# Patient Record
Sex: Female | Born: 1975 | Race: White | Hispanic: No | Marital: Married | Smoking: Never smoker
Health system: Southern US, Community
[De-identification: ages and names within clinical notes are randomized; demographics above are authoritative.]

## PROBLEM LIST (undated history)

## (undated) DIAGNOSIS — M1990 Unspecified osteoarthritis, unspecified site: Secondary | ICD-10-CM

## (undated) DIAGNOSIS — O0001 Abdominal pregnancy with intrauterine pregnancy: Secondary | ICD-10-CM

## (undated) DIAGNOSIS — O24419 Gestational diabetes mellitus in pregnancy, unspecified control: Secondary | ICD-10-CM

## (undated) DIAGNOSIS — E785 Hyperlipidemia, unspecified: Secondary | ICD-10-CM

## (undated) DIAGNOSIS — F988 Other specified behavioral and emotional disorders with onset usually occurring in childhood and adolescence: Secondary | ICD-10-CM

## (undated) HISTORY — DX: Gestational diabetes mellitus in pregnancy, unspecified control: O24.419

## (undated) HISTORY — PX: ANTERIOR CRUCIATE LIGAMENT REPAIR: SHX115

## (undated) HISTORY — DX: Unspecified osteoarthritis, unspecified site: M19.90

## (undated) HISTORY — DX: Abdominal pregnancy with intrauterine pregnancy: O00.01

## (undated) HISTORY — DX: Other specified behavioral and emotional disorders with onset usually occurring in childhood and adolescence: F98.8

## (undated) HISTORY — DX: Hyperlipidemia, unspecified: E78.5

---

## 2010-03-17 ENCOUNTER — Ambulatory Visit
Admission: RE | Admit: 2010-03-17 | Discharge: 2010-03-17 | Payer: Self-pay | Source: Home / Self Care | Attending: Internal Medicine | Admitting: Internal Medicine

## 2010-03-17 DIAGNOSIS — O0001 Abdominal pregnancy with intrauterine pregnancy: Secondary | ICD-10-CM

## 2010-03-17 HISTORY — DX: Abdominal pregnancy with intrauterine pregnancy: O00.01

## 2010-04-03 NOTE — Assessment & Plan Note (Signed)
Summary: NEW ACUTE/HEAD AND CHEST CONGESTION/PT 1 MONTH PREGNANT/OK PE...   Vital Signs:  Patient profile:   35 year old female Weight:      134 pounds Pulse rate:   74 / minute BP sitting:   114 / 78  (left arm)  Vitals Entered By: Kyung Rudd, CMA (March 17, 2010 3:50 PM) CC: pt c/o head and chest congestion and cough...has rx for amoxil from [place] and was just told by phn about a pos strep cx   CC:  pt c/o head and chest congestion and cough...has rx for amoxil from [place] and was just told by phn about a pos strep cx.  History of Present Illness: Patient presents to clinic as a workin for evaluation of sore throat. Notes 3d h/o ST, initial f/c now resolved, and productive cough. Was seen at [place] [facility] several days ago with reportedly neg rapid strep. Was contacted today and told + culture for strep. Recently found she was pregnant less than 6wks. No other complaints.  Preventive Screening-Counseling & Management  Alcohol-Tobacco     Smoking Status: never     Smoking Cessation Counseling: no     Tobacco Counseling: not indicated; no tobacco use  Problems Prior to Update: 1)  Abdominal Pregnancy With Intrauterine Pregnancy  (ICD-633.01)  Current Problems (verified): 1)  Abdominal Pregnancy With Intrauterine Pregnancy  (ICD-633.01)  Medications Prior to Update: 1)  None  Past History:  Past Medical History: Unremarkable  Social History: Smoking Status:  never  Review of Systems      See HPI  Physical Exam  General:  Well-developed,well-nourished,in no acute distress; alert,appropriate and cooperative throughout examination Head:  Normocephalic and atraumatic without obvious abnormalities. No apparent alopecia or balding. Eyes:  pupils equal, pupils round, corneas and lenses clear, and no injection.   Ears:  External ear exam shows no significant lesions or deformities.  Otoscopic examination reveals clear canals, tympanic membranes are intact bilaterally  without bulging, retraction, inflammation or discharge. Hearing is grossly normal bilaterally. Nose:  External nasal examination shows no deformity or inflammation. Nasal mucosa are pink and moist without lesions or exudates. Mouth:  posterior erythema without exudate.no lesions and no aphthous ulcers.   Neck:  No deformities, masses, or tenderness noted. Lungs:  Normal respiratory effort, chest expands symmetrically. Lungs are clear to auscultation, no crackles or wheezes. Heart:  Normal rate and regular rhythm. S1 and S2 normal without gallop, murmur, click, rub or other extra sounds.   Impression & Recommendations:  Problem # 1:  STREPTOCOCCAL PHARYNGITIS (ICD-034.0) Assessment New Begin abx. Followup if no improvement or worsening.  Her updated medication list for this problem includes:    Amoxicillin 875 Mg Tabs (Amoxicillin) ..... One by mouth bid  Problem # 2:  ABDOMINAL PREGNANCY WITH INTRAUTERINE PREGNANCY (ICD-633.01) Assessment: New  Orders: Obstetric Referral (Obstetric)  Complete Medication List: 1)  Amoxicillin 875 Mg Tabs (Amoxicillin) .... One by mouth bid Prescriptions: AMOXICILLIN 875 MG TABS (AMOXICILLIN) one by mouth bid  #14 x 0   Entered and Authorized by:   Edwyna Perfect MD   Signed by:   Edwyna Perfect MD on 03/17/2010   Method used:   Print then Give to Patient   RxID:   [id number]    Orders Added: 1)  Obstetric Referral [Obstetric] 2)  New Patient Level II [56213]

## 2011-05-07 ENCOUNTER — Other Ambulatory Visit: Payer: Self-pay

## 2011-05-07 ENCOUNTER — Other Ambulatory Visit: Payer: Self-pay | Admitting: Family Medicine

## 2011-05-07 DIAGNOSIS — M25571 Pain in right ankle and joints of right foot: Secondary | ICD-10-CM

## 2011-05-09 ENCOUNTER — Ambulatory Visit
Admission: RE | Admit: 2011-05-09 | Discharge: 2011-05-09 | Disposition: A | Discharge status: Home / Self Care | Payer: BC Managed Care – PPO | Source: Ambulatory Visit | Attending: Family Medicine | Admitting: Family Medicine

## 2011-05-09 DIAGNOSIS — M25571 Pain in right ankle and joints of right foot: Secondary | ICD-10-CM

## 2012-05-30 DIAGNOSIS — R87612 Low grade squamous intraepithelial lesion on cytologic smear of cervix (LGSIL): Secondary | ICD-10-CM | POA: Insufficient documentation

## 2012-05-30 HISTORY — DX: Low grade squamous intraepithelial lesion on cytologic smear of cervix (LGSIL): R87.612

## 2012-11-08 ENCOUNTER — Encounter: Payer: Self-pay | Admitting: *Deleted

## 2012-11-23 ENCOUNTER — Ambulatory Visit: Payer: BC Managed Care – PPO | Admitting: Cardiology

## 2014-10-17 ENCOUNTER — Telehealth: Payer: Self-pay | Admitting: Cardiology

## 2014-10-17 NOTE — Telephone Encounter (Signed)
Received records from [facility] for appointment with Dr Jens Som on 10/18/14.  Records given to [facility] (medical records) for Dr Ludwig Clarks schedule on 10/18/14. lp.

## 2014-10-18 ENCOUNTER — Ambulatory Visit (INDEPENDENT_AMBULATORY_CARE_PROVIDER_SITE_OTHER): Payer: 59 | Admitting: Cardiology

## 2014-10-18 ENCOUNTER — Encounter: Payer: Self-pay | Admitting: Cardiology

## 2014-10-18 VITALS — BP 122/84 | HR 60 | Ht 64.0 in | Wt 127.1 lb

## 2014-10-18 DIAGNOSIS — R072 Precordial pain: Secondary | ICD-10-CM

## 2014-10-18 DIAGNOSIS — R079 Chest pain, unspecified: Secondary | ICD-10-CM | POA: Diagnosis not present

## 2014-10-18 DIAGNOSIS — E785 Hyperlipidemia, unspecified: Secondary | ICD-10-CM | POA: Diagnosis not present

## 2014-10-18 DIAGNOSIS — E782 Mixed hyperlipidemia: Secondary | ICD-10-CM | POA: Insufficient documentation

## 2014-10-18 NOTE — Patient Instructions (Signed)
Your physician recommends that you schedule a follow-up appointment in: AS NEEDED PENDING TEST RESULTS  Your physician has requested that you have an exercise tolerance test. For further information please visit [email or web address]. Please also follow instruction sheet, as given.     Exercise Stress Electrocardiogram An exercise stress electrocardiogram is a test to check how blood flows to your heart. It is done to find areas of poor blood flow. You will need to walk on a treadmill for this test. The electrocardiogram will record your heartbeat when you are at rest and when you are exercising. BEFORE THE PROCEDURE  Do not have drinks with caffeine or foods with caffeine for 24 hours before the test, or as told by your doctor. This includes coffee, tea (even decaf tea), sodas, chocolate, and cocoa.  Follow your doctor's instructions about eating and drinking before the test.  Ask your doctor what medicines you should or should not take before the test. Take your medicines with water unless told by your doctor not to.  If you use an inhaler, bring it with you to the test.  Bring a snack to eat after the test.  Do not  smoke for 4 hours before the test.  Do not put lotions, powders, creams, or oils on your chest before the test.  Wear comfortable shoes and clothing. PROCEDURE  You will have patches put on your chest. Small areas of your chest may need to be shaved. Wires will be connected to the patches.  Your heart rate will be watched while you are resting and while you are exercising.  You will walk on the treadmill. The treadmill will slowly get faster to raise your heart rate.  The test will take about 1-2 hours. AFTER THE PROCEDURE  Your heart rate and blood pressure will be watched after the test.  You may return to your normal diet, activities, and medicines or as told by your doctor. Document Released: 08/05/2007 Document Revised: 07/03/2013 Document Reviewed:  10/24/2012 ExitCare Patient Information 2015 [employer]. This information is not intended to replace advice given to you by your health care provider. Make sure you discuss any questions you have with your health care provider.   

## 2014-10-18 NOTE — Assessment & Plan Note (Signed)
Continue diet. Management per primary care.

## 2014-10-18 NOTE — Assessment & Plan Note (Signed)
Symptoms atypical. Plan exercise treadmill for risk stratification. 

## 2014-10-18 NOTE — Progress Notes (Signed)
     HPI: 39 year old female for evaluation of chest pain. Patient states that for the past 6 months she has had occasional chest pain. The pain is above the left breast without radiation. It is described as a sharp pain. It occurs when she is running. It lasts 2 seconds and causes her to stop. No associated symptoms. Not pleuritic or positional. She also has an occasional "lightening bolt" pain when she is stressed. She denies dyspnea on exertion, orthopnea, PND, pedal edema, palpitations or syncope.  Current Outpatient Prescriptions  Medication Sig Dispense Refill  . norethindrone-ethinyl estradiol (MICROGESTIN,JUNEL,LOESTRIN) 1-20 MG-MCG tablet Take 1 tablet by mouth daily.     No current facility-administered medications for this visit.    Allergies  Allergen Reactions  . Demerol [Meperidine] Nausea Only     Past Medical History  Diagnosis Date  . Abdominal pregnancy with intrauterine pregnancy   . Hyperlipemia   . Gestational diabetes     Past Surgical History  Procedure Laterality Date  . Anterior cruciate ligament repair      Social History   Social History  . Marital Status: Divorced    Spouse Name: N/A  . Number of Children: 1  . Years of Education: N/A   Occupational History  .      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 Smoking status: Former Games developer  . Smokeless tobacco: Never Used  . Alcohol Use: 0.6 oz/week    1 Standard drinks or equivalent per week     Comment: Occasional  . Drug Use: No  . Sexual Activity: Not on file   Other Topics Concern  . Not on file   Social History Narrative    Family History  Problem Relation Age of Onset  . Heart attack Maternal Grandfather     4X  . Hypertension Maternal Grandmother   . Hypertension Mother   . Heart attack Maternal Grandmother   . Diabetes Father   . Stroke Mother     ROS: no fevers or chills, productive cough, hemoptysis, dysphasia, odynophagia, melena, hematochezia, dysuria,  hematuria, rash, seizure activity, orthopnea, PND, pedal edema, claudication. Remaining systems are negative.  Physical Exam:   Blood pressure 122/84, pulse 60, height  (1.626 m), weight 127 lb 2 oz (57.664 kg), last menstrual period 10/04/2014.  General:  Well developed/well nourished in NAD Skin warm/dry Patient not depressed No peripheral clubbing Back-normal HEENT-normal/normal eyelids Neck supple/normal carotid upstroke bilaterally; no bruits; no JVD; no thyromegaly chest - CTA/ normal expansion CV - RRR/normal S1 and S2; no murmurs, rubs or gallops;  PMI nondisplaced Abdomen -NT/ND, no HSM, no mass, + bowel sounds, no bruit 2+ femoral pulses, no bruits Ext-no edema, chords, 2+ DP Neuro-grossly nonfocal  ECG sinus rhythm at a rate of 60. Normal axis. No ST changes.

## 2014-11-09 ENCOUNTER — Telehealth (HOSPITAL_COMMUNITY): Payer: Self-pay

## 2014-11-09 NOTE — Telephone Encounter (Signed)
Encounter complete. 

## 2014-11-14 ENCOUNTER — Inpatient Hospital Stay (HOSPITAL_COMMUNITY): Admission: RE | Admit: 2014-11-14 | Payer: 59 | Source: Ambulatory Visit

## 2014-12-24 ENCOUNTER — Ambulatory Visit: Payer: Self-pay | Admitting: Cardiovascular Disease

## 2014-12-27 ENCOUNTER — Telehealth: Payer: Self-pay | Admitting: Cardiology

## 2014-12-27 NOTE — Telephone Encounter (Signed)
Patient called stating she would like to get a stress test as she is still having chest pain while she is running.  She states she wants to have a stress test for an athlete as she gets the chest pain while running.  There are no orders for a stress test in the system.  Please call her at [phone].

## 2014-12-27 NOTE — Telephone Encounter (Signed)
Evelyn Sellers Please call this patient.

## 2014-12-31 NOTE — Telephone Encounter (Signed)
error 

## 2015-03-22 ENCOUNTER — Telehealth (HOSPITAL_COMMUNITY): Payer: Self-pay

## 2015-03-22 NOTE — Telephone Encounter (Signed)
Encounter complete. 

## 2015-03-27 ENCOUNTER — Ambulatory Visit (HOSPITAL_COMMUNITY)
Admission: RE | Admit: 2015-03-27 | Discharge: 2015-03-27 | Disposition: A | Discharge status: Home / Self Care | Payer: 59 | Source: Ambulatory Visit | Attending: Cardiology | Admitting: Cardiology

## 2015-03-27 DIAGNOSIS — R079 Chest pain, unspecified: Secondary | ICD-10-CM

## 2015-03-29 LAB — EXERCISE TOLERANCE TEST
CSEPEW: 17.2 METS
CSEPHR: 100 %
CSEPPHR: 181 {beats}/min
Exercise duration (min): 14 min
Exercise duration (sec): 0 s
MPHR: 180 {beats}/min
RPE: 17
Rest HR: 61 {beats}/min

## 2015-05-20 NOTE — Progress Notes (Signed)
      HPI: FU CP. ETT 1/17 was normal. Since last seen, Patient continues to complain of chest pain. It is in the left chest area. She describes it as a deep pain. She notices this only after running approximately 2 miles. She stops and it resolves. She has not had these symptoms at rest. It does not radiate and there are no associated symptoms. She otherwise does not have dyspnea on exertion, orthopnea, PND, pedal edema. No palpitations or syncope.  Current Outpatient Prescriptions  Medication Sig Dispense Refill  . norethindrone-ethinyl estradiol (MICROGESTIN,JUNEL,LOESTRIN) 1-20 MG-MCG tablet Take 1 tablet by mouth daily.     No current facility-administered medications for this visit.     Past Medical History  Diagnosis Date  . Abdominal pregnancy with intrauterine pregnancy   . Hyperlipemia   . Gestational diabetes     Past Surgical History  Procedure Laterality Date  . Anterior cruciate ligament repair      Social History   Social History  . Marital Status: Divorced    Spouse Name: N/A  . Number of Children: 1  . Years of Education: N/A   Occupational History  .      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 Smoking status: Former Games developermoker  . Smokeless tobacco: Never Used  . Alcohol Use: 0.6 oz/week    1 Standard drinks or equivalent per week     Comment: Occasional  . Drug Use: No  . Sexual Activity: Not on file   Other Topics Concern  . Not on file   Social History Narrative    Family History  Problem Relation Age of Onset  . Heart attack Maternal Grandfather     4X  . Hypertension Maternal Grandmother   . Hypertension Mother   . Heart attack Maternal Grandmother   . Diabetes Father   . Stroke Mother     ROS: no fevers or chills, productive cough, hemoptysis, dysphasia, odynophagia, melena, hematochezia, dysuria, hematuria, rash, seizure activity, orthopnea, PND, pedal edema, claudication. Remaining systems are negative.  Physical  Exam: Well-developed well-nourished in no acute distress.  Skin is warm and dry.  HEENT is normal.  Neck is supple.  Chest is clear to auscultation with normal expansion.  Cardiovascular exam is regular rate and rhythm.  Abdominal exam nontender or distended. No masses palpated. Extremities show no edema. neuro grossly intact  ECG Sinus bradycardia with no ST changes.

## 2015-05-24 ENCOUNTER — Encounter: Payer: Self-pay | Admitting: Cardiology

## 2015-05-24 ENCOUNTER — Ambulatory Visit (INDEPENDENT_AMBULATORY_CARE_PROVIDER_SITE_OTHER): Payer: 59 | Admitting: Cardiology

## 2015-05-24 VITALS — BP 136/80 | HR 50 | Ht 64.0 in | Wt 136.0 lb

## 2015-05-24 DIAGNOSIS — E785 Hyperlipidemia, unspecified: Secondary | ICD-10-CM

## 2015-05-24 DIAGNOSIS — R072 Precordial pain: Secondary | ICD-10-CM

## 2015-05-24 NOTE — Patient Instructions (Signed)
Medication Instructions:   NO CHANGE.   Testing/Procedures:  Your physician has requested that you have cardiac CT. Cardiac computed tomography (CT) is a painless test that uses an x-ray machine to take clear, detailed pictures of your heart. For further information please visit [email or web address]. Please follow instruction sheet as given.     Follow-Up:  Your physician recommends that you schedule a follow-up appointment in: 3 MONTHS WITH DR CRENSHAW.  If you need a refill on your cardiac medications before your next appointment, please call your pharmacy.    Any Other Special Instructions Will Be Listed Below (If Applicable).

## 2015-05-24 NOTE — Assessment & Plan Note (Signed)
Etiology unclear. She continues to have symptoms with exertion relieved with rest. Question false negative exercise treadmill. She is very concerned about her symptoms particularly given her family history. We will arrange a cardiac CTA to evaluate coronaries and calcium score.

## 2015-05-24 NOTE — Assessment & Plan Note (Signed)
We will review her most recent lipids and make recommendations once available. She will obtain these from her primary care physician.

## 2015-06-17 ENCOUNTER — Encounter: Payer: Self-pay | Admitting: Cardiology

## 2015-06-17 ENCOUNTER — Telehealth: Payer: Self-pay | Admitting: Cardiology

## 2015-06-17 DIAGNOSIS — E785 Hyperlipidemia, unspecified: Secondary | ICD-10-CM

## 2015-06-17 DIAGNOSIS — R072 Precordial pain: Secondary | ICD-10-CM

## 2015-06-17 NOTE — Telephone Encounter (Signed)
Patient is schedule for Cardiac Ct on 06-26-15 @ 11:00 and will need labs before. She also have several question regarding the Cardiac Ct.

## 2015-06-19 NOTE — Telephone Encounter (Signed)
Left message for pt to call.

## 2015-06-20 ENCOUNTER — Telehealth: Payer: Self-pay | Admitting: *Deleted

## 2015-06-20 DIAGNOSIS — Z79899 Other long term (current) drug therapy: Secondary | ICD-10-CM

## 2015-06-20 LAB — BASIC METABOLIC PANEL
BUN: 13 mg/dL (ref 7–25)
CALCIUM: 9.3 mg/dL (ref 8.6–10.2)
CO2: 24 mmol/L (ref 20–31)
Chloride: 103 mmol/L (ref 98–110)
Creat: 0.77 mg/dL (ref 0.50–1.10)
Glucose, Bld: 69 mg/dL (ref 65–99)
Potassium: 5.1 mmol/L (ref 3.5–5.3)
SODIUM: 138 mmol/L (ref 135–146)

## 2015-06-20 LAB — LIPID PANEL
Cholesterol: 225 mg/dL — ABNORMAL HIGH (ref 125–200)
HDL: 63 mg/dL (ref >=46)
LDL CALC: 146 mg/dL — AB (ref <130)
Total CHOL/HDL Ratio: 3.6 Ratio (ref <=5.0)
Triglycerides: 79 mg/dL (ref <150)
VLDL: 16 mg/dL (ref <30)

## 2015-06-20 NOTE — Telephone Encounter (Signed)
[facility] called - initial BMET order not visible.

## 2015-06-20 NOTE — Telephone Encounter (Signed)
Pt called in wanting to know if she could have a full panel of lab orders placed for when she comes in for lab work for cardiac CT. Please f/u with pt  Thanks

## 2015-06-20 NOTE — Telephone Encounter (Signed)
Spoke with pt, lab orders placed. Questions answered.

## 2015-06-21 ENCOUNTER — Telehealth: Payer: Self-pay | Admitting: Cardiology

## 2015-06-21 DIAGNOSIS — Z79899 Other long term (current) drug therapy: Secondary | ICD-10-CM

## 2015-06-21 DIAGNOSIS — E785 Hyperlipidemia, unspecified: Secondary | ICD-10-CM

## 2015-06-21 MED ORDER — ATORVASTATIN CALCIUM 20 MG PO TABS: 20.0000 mg | ORAL_TABLET | Freq: Every day | ORAL | Status: DC

## 2015-06-21 NOTE — Telephone Encounter (Signed)
Results of test thoroughly explained. New med recommendation given. Recommendations for liver function & repeat lipids in 4 weeks given.  Pt had questions r/t pregnancy and possible complications w/ using Lipitor and wanted a call from someone who could answer these.  I informed her I would have Belenda CruiseKristin reach out to her and give her recommendations/advice.

## 2015-06-21 NOTE — Telephone Encounter (Signed)
Returned call, patient considering pregnancy.  Advised that she should stop atorvastatin at the same time she stops using birth control.  Patient voiced understanding.

## 2015-06-21 NOTE — Telephone Encounter (Signed)
New message      Returning a call to the nurse to get lab results.  Also, pt has questions about upcoming CT test

## 2015-06-24 ENCOUNTER — Ambulatory Visit (HOSPITAL_COMMUNITY): Payer: 59

## 2015-06-26 ENCOUNTER — Encounter (HOSPITAL_COMMUNITY): Payer: Self-pay

## 2015-06-26 ENCOUNTER — Ambulatory Visit (HOSPITAL_COMMUNITY)
Admission: RE | Admit: 2015-06-26 | Discharge: 2015-06-26 | Disposition: A | Discharge status: Home / Self Care | Payer: 59 | Source: Ambulatory Visit | Attending: Cardiology | Admitting: Cardiology

## 2015-06-26 ENCOUNTER — Ambulatory Visit (HOSPITAL_COMMUNITY): Payer: 59

## 2015-06-26 DIAGNOSIS — R072 Precordial pain: Secondary | ICD-10-CM | POA: Insufficient documentation

## 2015-06-26 DIAGNOSIS — R918 Other nonspecific abnormal finding of lung field: Secondary | ICD-10-CM | POA: Diagnosis not present

## 2015-06-26 MED ORDER — NITROGLYCERIN 0.4 MG SL SUBL
SUBLINGUAL_TABLET | SUBLINGUAL | Status: AC
  Filled 2015-06-26: qty 1

## 2015-06-26 MED ORDER — NITROGLYCERIN 0.4 MG SL SUBL
0.4000 mg | SUBLINGUAL_TABLET | Freq: Once | SUBLINGUAL | Status: AC
  Administered 2015-06-26: 0.4 mg via SUBLINGUAL
  Filled 2015-06-26: qty 25

## 2015-06-26 MED ORDER — IOPAMIDOL (ISOVUE-370) INJECTION 76%
INTRAVENOUS | Status: AC
  Administered 2015-06-26: 80 mL
  Filled 2015-06-26: qty 100

## 2015-06-26 MED ORDER — METOPROLOL TARTRATE 5 MG/5ML IV SOLN
INTRAVENOUS | Status: AC
  Filled 2015-06-26: qty 5

## 2015-06-26 MED ORDER — METOPROLOL TARTRATE 5 MG/5ML IV SOLN
2.5000 mg | Freq: Once | INTRAVENOUS | Status: AC
  Administered 2015-06-26: 2.5 mg via INTRAVENOUS
  Filled 2015-06-26: qty 5

## 2015-06-26 NOTE — Progress Notes (Signed)
CT scan completed. Tolerated well. D/C home walking. Awake and alert. In no distress. 

## 2015-07-02 ENCOUNTER — Ambulatory Visit (INDEPENDENT_AMBULATORY_CARE_PROVIDER_SITE_OTHER): Payer: 59 | Admitting: Pharmacist

## 2015-07-02 DIAGNOSIS — E785 Hyperlipidemia, unspecified: Secondary | ICD-10-CM | POA: Diagnosis not present

## 2015-07-02 NOTE — Progress Notes (Signed)
Patient ID: Evelyn Sellers                 DOB: 05/02/1975                    MRN: [id number]     HPI: Evelyn Sellers is a 40 y.o. female patient referred to lipid clinic by Dr. Jens Somrenshaw. PMH significant for gestational DM, mild HLD, and hx of chest pain while running > 2 miles at a time. Pt had an exercise tolerance test in January 2017 that was normal. Etiology of chest pain unclear, pt continued to have symptoms upon exertion relieved with rest. She underwent a cardiac CTA and calcium score was 0 with no coronary disease and low risk of future cardiac events.   Pt was prescribed Lipitor 20mg  daily but did not start taking it due to concern over myalgia side effects that she has read about. Inquired about potential for future pregnancy and patient stated that she is not currently trying to become pregnant. She is concerned abut her elevated LDL due to her family history of CVD and is interested in only lifestyle modifications at this time. She has many questions about foods to avoid and potential herbal supplements to take, such as red yeast rice.  Current Medications: none Intolerances: none Risk Factors: family history of CVD, hx of gestational DM LDL goal: 130mg /dL  Diet: Was following Atkins diet with low carb high protein content. Breakfast: 5 pieces Malawiturkey bacon, 2 eggs. Eats a lot of cheese. Steak for dinner. Likes avocados. Cooks with coconut oil and drinks whole milk (buys it for her 41 year old).  Exercise: Running, biking, weight lifting. Triatholon last year.  Family History: Mother with high cholesterol and stroke, maternal grandfather with 4 MIs (first one in 6150s but was a big smoker and drinker), maternal grandmother with MI, father with DM.  Social History: Former smoker. Drinks occasional alcohol, denies illicit drug use.  Labs: 06/2015: TC 225, TG 79, HDL 63, LDL 146 (no therapy)  Past Medical History  Diagnosis Date  . Abdominal pregnancy with intrauterine pregnancy   .  Hyperlipemia   . Gestational diabetes     Current Outpatient Prescriptions on File Prior to Visit  Medication Sig Dispense Refill  . atorvastatin (LIPITOR) 20 MG tablet Take 1 tablet (20 mg total) by mouth daily. (Patient not taking: Reported on 06/26/2015) 30 tablet 3  . norethindrone-ethinyl estradiol (MICROGESTIN,JUNEL,LOESTRIN) 1-20 MG-MCG tablet Take 1 tablet by mouth daily.     No current facility-administered medications on file prior to visit.    Allergies  Allergen Reactions  . Demerol [Meperidine] Nausea Only    Assessment/Plan:  1. Hyperlipidemia - Patient's LDL 146 above conservative goal < 130 (pt with family history of CVD but pt had negative stress test and coronary calcium score of 0 with no CAD). Pt does not currently want to start lipid lowering therapy. Discussed potential options for lipid lowering therapy and pt seemed willing to try low dose Crestor in the future if needed. She would like to focus solely on lifestyle changes over the next 3 months. Discussed diet extensively since pt already exercises frequently. She will work to cut back on steak and Malawiturkey bacon, coconut oil, cheese, and whole milk. Discussed nutrition labels and provided patient with dietary handouts. Anticipate that diet changes will bring LDL to < 130. Pt will follow up with Dr. Jens Somrenshaw as scheduled.   Jennie Hannay E. Timber Lucarelli, PharmD, CPP [facility]  [address], [place], [place] [address] Phone: [phone]; Fax: [phone] 07/02/2015 4:58 PM

## 2015-08-19 NOTE — Progress Notes (Signed)
      HPI: FU CP. ETT 1/17 was normal. Cardiac CT April 2017 showed calcium score of 0 and no coronary artery disease. Since last seen,   Current Outpatient Prescriptions  Medication Sig Dispense Refill  . norethindrone-ethinyl estradiol (MICROGESTIN,JUNEL,LOESTRIN) 1-20 MG-MCG tablet Take 1 tablet by mouth daily.     No current facility-administered medications for this visit.     Past Medical History  Diagnosis Date  . Abdominal pregnancy with intrauterine pregnancy   . Hyperlipemia   . Gestational diabetes     Past Surgical History  Procedure Laterality Date  . Anterior cruciate ligament repair      Social History   Social History  . Marital Status: Divorced    Spouse Name: N/A  . Number of Children: 1  . Years of Education: N/A   Occupational History  .      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 Smoking status: Former Games developermoker  . Smokeless tobacco: Never Used  . Alcohol Use: 0.6 oz/week    1 Standard drinks or equivalent per week     Comment: Occasional  . Drug Use: No  . Sexual Activity: Not on file   Other Topics Concern  . Not on file   Social History Narrative    Family History  Problem Relation Age of Onset  . Heart attack Maternal Grandfather     4X  . Hypertension Maternal Grandmother   . Hypertension Mother   . Heart attack Maternal Grandmother   . Diabetes Father   . Stroke Mother     ROS: no fevers or chills, productive cough, hemoptysis, dysphasia, odynophagia, melena, hematochezia, dysuria, hematuria, rash, seizure activity, orthopnea, PND, pedal edema, claudication. Remaining systems are negative.  Physical Exam: Well-developed well-nourished in no acute distress.  Skin is warm and dry.  HEENT is normal.  Neck is supple.  Chest is clear to auscultation with normal expansion.  Cardiovascular exam is regular rate and rhythm.  Abdominal exam nontender or distended. No masses palpated. Extremities show no edema. neuro  grossly intact  ECG     This encounter was created in error - please disregard.

## 2015-08-27 ENCOUNTER — Encounter: Payer: 59 | Admitting: Cardiology

## 2016-06-05 ENCOUNTER — Encounter: Payer: Self-pay | Admitting: *Deleted

## 2017-02-15 ENCOUNTER — Other Ambulatory Visit: Payer: Self-pay | Admitting: Family Medicine

## 2017-02-15 ENCOUNTER — Ambulatory Visit
Admission: RE | Admit: 2017-02-15 | Discharge: 2017-02-15 | Disposition: A | Discharge status: Home / Self Care | Payer: Medicaid Other | Source: Ambulatory Visit | Attending: Family Medicine | Admitting: Family Medicine

## 2017-02-15 DIAGNOSIS — R0602 Shortness of breath: Secondary | ICD-10-CM

## 2017-03-04 ENCOUNTER — Telehealth: Payer: Self-pay | Admitting: Cardiology

## 2017-03-04 DIAGNOSIS — R911 Solitary pulmonary nodule: Secondary | ICD-10-CM

## 2017-03-04 NOTE — Telephone Encounter (Signed)
Spoke with pt, aware she does need to have a non-contrast to follow up a lung nodule found on cardiac CT. Order placed and she will call the [place] location to schedule testing.

## 2017-03-04 NOTE — Telephone Encounter (Signed)
New message     Patient is calling to schedule MRI and calcium test but there is no order in the system, please update and call patient with appointment

## 2017-03-04 NOTE — Telephone Encounter (Signed)
Pt of Dr. Jens Somrenshaw  Upcoming return OV 04/22/17. Pt calling to schedule follow up testing. She had cardiac CT ~2 years ago and was recommended to repeat non contrast cardiac CT in 1 years time.  Please advise if OK to order and if any other testing is recommended.

## 2017-03-23 ENCOUNTER — Ambulatory Visit (HOSPITAL_BASED_OUTPATIENT_CLINIC_OR_DEPARTMENT_OTHER): Payer: Medicaid Other

## 2017-03-29 ENCOUNTER — Ambulatory Visit (HOSPITAL_BASED_OUTPATIENT_CLINIC_OR_DEPARTMENT_OTHER)
Admission: RE | Admit: 2017-03-29 | Discharge: 2017-03-29 | Disposition: A | Discharge status: Home / Self Care | Payer: Medicaid Other | Source: Ambulatory Visit | Attending: Cardiology | Admitting: Cardiology

## 2017-03-29 DIAGNOSIS — N2889 Other specified disorders of kidney and ureter: Secondary | ICD-10-CM | POA: Insufficient documentation

## 2017-03-29 DIAGNOSIS — R911 Solitary pulmonary nodule: Secondary | ICD-10-CM | POA: Diagnosis present

## 2017-03-29 DIAGNOSIS — R918 Other nonspecific abnormal finding of lung field: Secondary | ICD-10-CM | POA: Diagnosis not present

## 2017-04-12 NOTE — Progress Notes (Deleted)
      HPI: FU CP. ETT 1/17 was normal.  Cardiac CTA April 2017 showed a calcium score of 0 and no coronary disease.  Since last seen,   Current Outpatient Medications  Medication Sig Dispense Refill  . norethindrone-ethinyl estradiol (MICROGESTIN,JUNEL,LOESTRIN) 1-20 MG-MCG tablet Take 1 tablet by mouth daily.     No current facility-administered medications for this visit.      Past Medical History:  Diagnosis Date  . Abdominal pregnancy with intrauterine pregnancy   . Gestational diabetes   . Hyperlipemia     Past Surgical History:  Procedure Laterality Date  . ANTERIOR CRUCIATE LIGAMENT REPAIR      Social History   Socioeconomic History  . Marital status: Divorced    Spouse name: Not on file  . Number of children: 1  . Years of education: Not on file  . Highest education level: Not on file  Social Needs  . Financial resource strain: Not on file  . Food insecurity - worry: Not on file  . Food insecurity - inability: Not on file  . Transportation needs - medical: Not on file  . 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 Smoking status: Former Games developermoker  . Smokeless tobacco: Never Used  Substance and Sexual Activity  . Alcohol use: Yes    Alcohol/week: 0.6 oz    Types: 1 Standard drinks or equivalent per week    Comment: Occasional  . Drug use: No  . Sexual activity: Not on file  Other Topics Concern  . Not on file  Social History Narrative  . Not on file    Family History  Problem Relation Age of Onset  . Heart attack Maternal Grandfather        4X  . Hypertension Maternal Grandmother   . Hypertension Mother   . Heart attack Maternal Grandmother   . Diabetes Father   . Stroke Mother     ROS: no fevers or chills, productive cough, hemoptysis, dysphasia, odynophagia, melena, hematochezia, dysuria, hematuria, rash, seizure activity, orthopnea, PND, pedal edema, claudication.  Remaining systems are negative.  Physical Exam: Well-developed well-nourished in no acute distress.  Skin is warm and dry.  HEENT is normal.  Neck is supple.  Chest is clear to auscultation with normal expansion.  Cardiovascular exam is regular rate and rhythm.  Abdominal exam nontender or distended. No masses palpated. Extremities show no edema. neuro grossly intact  ECG- personally reviewed  A/P  1 chest pain-previous evaluation negative.  This included a CTA with no coronary disease and calcium score of 0.  We will not pursue further evaluation.  2 hyperlipidemia-continue present medications.  Monitored by primary care.  Olga MillersBrian Abdulraheem Pineo, MD

## 2017-04-22 ENCOUNTER — Ambulatory Visit: Payer: Medicaid Other | Admitting: Cardiology

## 2017-06-07 ENCOUNTER — Telehealth: Payer: Self-pay | Admitting: Cardiology

## 2017-06-07 NOTE — Telephone Encounter (Signed)
New Message  Pt would like to know the results to her CT, and also states she is trying to get a Calcium test done. Please call

## 2017-06-07 NOTE — Telephone Encounter (Signed)
Left message for patient of dr Ludwig Clarkscrenshaw's recommendations. She is to call back if having chest pain.

## 2017-06-07 NOTE — Telephone Encounter (Signed)
I do not think she needs another CT unless symptoms have changed. Evelyn Sellers/Evelyn Sellers Evelyn Sellers

## 2017-06-07 NOTE — Telephone Encounter (Signed)
Patient called with chest CT results  Notes recorded by Freddi StarrMathis, Debra W, RN on 03/30/2017 at 12:50 PM EST Left message of results for pt ------ Notes recorded by Lewayne Buntingrenshaw, Brian S, MD on 03/29/2017 at 1:58 PM EST No further fu necessary Olga MillersBrian Crenshaw  She is inquiring about another coronary calcium score test. She states another MD ordered this for her and it is scheduled for next week. She wants to know if this is necessary. She does report Dr. Perlie GoldGreywall (OB-Gyn) checked her cholesterol and it was up again.. She states it was lower at one point while she was taking Juice Plus. There are labs in [facility] from 11/2016 (LDL 162, HDL 64, total cholesterol 161245, trigs 85). She states she is not interested in taking a statin. She would like MD opinion on repeat calcium score testing (last was 06/2015 and score was 0) and cholesterol before her July appointment.

## 2017-06-09 DIAGNOSIS — M1611 Unilateral primary osteoarthritis, right hip: Secondary | ICD-10-CM | POA: Insufficient documentation

## 2017-07-01 DIAGNOSIS — Z7251 High risk heterosexual behavior: Secondary | ICD-10-CM | POA: Diagnosis not present

## 2017-07-01 DIAGNOSIS — Z113 Encounter for screening for infections with a predominantly sexual mode of transmission: Secondary | ICD-10-CM | POA: Diagnosis not present

## 2017-07-01 DIAGNOSIS — N898 Other specified noninflammatory disorders of vagina: Secondary | ICD-10-CM | POA: Diagnosis not present

## 2017-09-03 NOTE — Progress Notes (Signed)
HPI FU CP. ETT 1/17 was normal. Cardiac CTA 4/17 showed Ca score 0 and no CAD; also with nodules but fu CT 1/19 with no change and no FU recommended. Since last seen, patient continues to have occasional pain in left upper chest with vigorous activities relieved with rest.  Some increase with inspiration.  Unchanged compared to previous.  Also an occasional shocklike sensation with stress.  No dyspnea or syncope.  No current outpatient medications on file.   No current facility-administered medications for this visit.      Past Medical History:  Diagnosis Date  . Abdominal pregnancy with intrauterine pregnancy   . Gestational diabetes   . Hyperlipemia     Past Surgical History:  Procedure Laterality Date  . ANTERIOR CRUCIATE LIGAMENT REPAIR      Social History   Socioeconomic History  . Marital status: Divorced    Spouse name: Not on file  . Number of children: 1  . Years of education: Not on file  . 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 Financial resource strain: Not on file  . Food insecurity:    Worry: Not on file    Inability: Not on file  . Transportation needs:    Medical: Not on file    Non-medical: Not on file  Tobacco Use  . Smoking status: Former Games developer  . Smokeless tobacco: Never Used  Substance and Sexual Activity  . Alcohol use: Yes    Alcohol/week: 0.6 oz    Types: 1 Standard drinks or equivalent per week    Comment: Occasional  . Drug use: No  . Sexual activity: Not on file  Lifestyle  . Physical activity:    Days per week: Not on file    Minutes per session: Not on file  . Stress: Not on file  Relationships  . Social connections:    Talks on phone: Not on file    Gets together: Not on file    Attends religious service: Not on file    Active member of club or organization: Not on file    Attends meetings of clubs or organizations: Not on file    Relationship status:  Not on file  . Intimate partner violence:    Fear of current or ex partner: Not on file    Emotionally abused: Not on file    Physically abused: Not on file    Forced sexual activity: Not on file  Other Topics Concern  . Not on file  Social History Narrative  . Not on file    Family History  Problem Relation Age of Onset  . Hypertension Mother   . Stroke Mother   . Diabetes Father   . Hypertension Maternal Grandmother   . Heart attack Maternal Grandmother   . Heart attack Maternal Grandfather        4X    ROS: no fevers or chills, productive cough, hemoptysis, dysphasia, odynophagia, melena, hematochezia, dysuria, hematuria, rash, seizure activity, orthopnea, PND, pedal edema, claudication. Remaining systems are negative.  Physical Exam: Well-developed well-nourished in no acute distress.  Skin is warm and dry.  HEENT is normal.  Neck is supple.  Chest is clear to auscultation with normal expansion.  Cardiovascular exam is regular rate and rhythm.  Abdominal exam nontender or distended. No masses palpated. Extremities show no edema. neuro grossly intact  ECG-sinus rhythm at a rate of 67.  No ST changes.  Personally reviewed  A/P  1 chest pain-symptoms atypical.  Previous evaluation negative including cardiac CTA showing no coronary disease and calcium score 0.  No plans for further ischemia evaluation at this point.  2 hyperlipidemia-total cholesterol 245 October 2018 with LDL 162.  I will add Lipitor 20 mg daily.  Check lipids and liver in 4 weeks.  Olga MillersBrian Leo Weyandt, MD

## 2017-09-09 ENCOUNTER — Encounter: Payer: Self-pay | Admitting: Cardiology

## 2017-09-09 ENCOUNTER — Encounter

## 2017-09-09 ENCOUNTER — Ambulatory Visit (INDEPENDENT_AMBULATORY_CARE_PROVIDER_SITE_OTHER): Payer: Federal, State, Local not specified - PPO | Admitting: Cardiology

## 2017-09-09 VITALS — BP 119/70 | HR 67 | Ht 63.0 in | Wt 120.0 lb

## 2017-09-09 DIAGNOSIS — R072 Precordial pain: Secondary | ICD-10-CM | POA: Diagnosis not present

## 2017-09-09 DIAGNOSIS — E785 Hyperlipidemia, unspecified: Secondary | ICD-10-CM | POA: Diagnosis not present

## 2017-09-09 MED ORDER — ATORVASTATIN CALCIUM 20 MG PO TABS: 20.0000 mg | ORAL_TABLET | Freq: Every day | ORAL | 3 refills | Status: DC

## 2017-09-09 NOTE — Patient Instructions (Signed)
Your physician has recommended you make the following change in your medication: START atorvastatin (lipitor) 20mg  once daily  Your physician recommends that you return for lab work in 4 weeks (fasting) to check cholesterol & liver function   Your physician wants you to follow-up in: 1 year with Dr. Jens Somrenshaw. You will receive a reminder letter in the mail two months in advance. If you don't receive a letter, please call our office to schedule the follow-up appointment.

## 2017-10-28 ENCOUNTER — Encounter: Payer: Self-pay | Admitting: *Deleted

## 2017-11-10 DIAGNOSIS — J0100 Acute maxillary sinusitis, unspecified: Secondary | ICD-10-CM | POA: Diagnosis not present

## 2017-11-10 DIAGNOSIS — J029 Acute pharyngitis, unspecified: Secondary | ICD-10-CM | POA: Diagnosis not present

## 2017-11-10 DIAGNOSIS — B373 Candidiasis of vulva and vagina: Secondary | ICD-10-CM | POA: Diagnosis not present

## 2017-11-19 DIAGNOSIS — N762 Acute vulvitis: Secondary | ICD-10-CM | POA: Diagnosis not present

## 2017-11-19 DIAGNOSIS — R32 Unspecified urinary incontinence: Secondary | ICD-10-CM | POA: Diagnosis not present

## 2017-11-19 DIAGNOSIS — N7689 Other specified inflammation of vagina and vulva: Secondary | ICD-10-CM | POA: Diagnosis not present

## 2017-12-06 DIAGNOSIS — J069 Acute upper respiratory infection, unspecified: Secondary | ICD-10-CM | POA: Diagnosis not present

## 2017-12-09 ENCOUNTER — Telehealth: Payer: Self-pay | Admitting: Cardiology

## 2017-12-09 DIAGNOSIS — E785 Hyperlipidemia, unspecified: Secondary | ICD-10-CM

## 2017-12-09 NOTE — Telephone Encounter (Signed)
Spoke with pt. Pt sts that she was started on Atorvastatin 20mg  daily in July 2019 by [facility]. Within in a 1-2 weeks she began to develop muscle cramps.   Pt is a nurse, she started to reduce the dosage to see if it would be tolerable. She reduced it to 10mg  and then 5mg . She was still not able to tolerate. She began taking red yeast rice and was till having muscle pain. She has not been taking anything for the last 2 weeks, but she is still having muscle pain. Both her parents have hyperlipidemia and are on statin therapy. She she is wondering if she needs to be on the same statin as her parents since they seem to tolerate their statin.  She is interested in having a consult with the Pharmacist in the lipid clinic. Ref placed. Update fwd to Dr.Crenshaw.

## 2017-12-09 NOTE — Telephone Encounter (Signed)
New Message:     Pt c/o medication issue:  1. Name of Medication: atorvastatin (LIPITOR) 20 MG tablet(Expired)  2. How are you currently taking this medication (dosage and times per day)? Take 1 tablet (20 mg total) by mouth daily.  3. Are you having a reaction (difficulty breathing--STAT)? No   4. What is your medication issue? Patient has questions about the side effects of the medication, Leg Pain

## 2017-12-09 NOTE — Telephone Encounter (Signed)
Note created in error.

## 2017-12-10 NOTE — Telephone Encounter (Signed)
Unable to reach pt or leave a message mailbox is full 

## 2017-12-10 NOTE — Telephone Encounter (Signed)
Agree with lipid clinic eval Evelyn Sellers

## 2017-12-14 ENCOUNTER — Ambulatory Visit: Payer: Federal, State, Local not specified - PPO

## 2017-12-14 NOTE — Telephone Encounter (Signed)
Follow up scheduled with the pharm md

## 2017-12-14 NOTE — Progress Notes (Deleted)
12/14/2017 Karlton Lemon [birth year] [id number]   HPI:  Coleen Cardiff is a 42 y.o. female patient of Dr Jens Som, who presents today for a lipid clinic evaluation.  Her only other significant cardiac issue was previous chest pain with prolonged exercise.  She has a strong family history of CAD (see below), but a calcium score done in 2017 was a 0.      Current Medications:  Risk Factors:  Cholesterol Goals:   Intolerant/previously tried:  Family history:   Diet:   Exercise:    Labs:  @LASTLAB3 (LIPID)@  Current Outpatient Medications  Medication Sig Dispense Refill  . atorvastatin (LIPITOR) 20 MG tablet Take 1 tablet (20 mg total) by mouth daily. 90 tablet 3   No current facility-administered medications for this visit.     Allergies  Allergen Reactions  . Demerol [Meperidine] Nausea Only    Past Medical History:  Diagnosis Date  . Abdominal pregnancy with intrauterine pregnancy   . Gestational diabetes   . Hyperlipemia     There were no vitals taken for this visit.   No problem-specific Assessment & Plan notes found for this encounter.   Phillips Hay PharmD CPP [facility]

## 2017-12-21 ENCOUNTER — Ambulatory Visit (INDEPENDENT_AMBULATORY_CARE_PROVIDER_SITE_OTHER): Payer: Federal, State, Local not specified - PPO | Admitting: Pharmacist Clinician (PhC)/ Clinical Pharmacy Specialist

## 2017-12-21 DIAGNOSIS — E785 Hyperlipidemia, unspecified: Secondary | ICD-10-CM | POA: Diagnosis not present

## 2017-12-21 NOTE — Patient Instructions (Signed)
Get cholesterol labs drawn in the next week or two.    Will have you start on rosuvastatin (Crestor) 5 mg once weekly, once the cramping in your leg has gone.  After about 4-5 weeks, increase dose to twice weekly.  Increase by 1 tablet weekly every 4-5 weeks.    If your cholesterol is high enough ( LDL > 190) we should be able to get the Repatha injectable medication.  If we have to wait, the FDA should be approving bempedoic acid in February.  Call once you are feeling ready to start the rosuvastatin.  Kristin/Raquel at [phone]

## 2017-12-21 NOTE — Progress Notes (Signed)
12/22/2017 Karlton Lemon December 16, 1975 [id number]   HPI:  Evelyn Sellers is a 42 y.o. female patient of Dr Jens Som, who presents today for a lipid clinic evaluation.  Other than hyperlipidemia, her medical history is significant only for gestational diabetes.  She had a cardiac CT scan which showed a calcium score of 0.  She has recently had some problems with chest pain associated with vigorous activity, usually relieved by rest.  She has had no shortness of breath or dizziness.  After being unable to tolerate the atorvastatin, she switched to OTC red yeast rice, but developed myalgias within a week or two of starting that.    Current Medications:  none  Cholesterol Goals:   LDL < 100  Intolerant/previously tried:  Atorvastatin 20 mg - leg cramping, eased down to 5 mg daily but continued.    Red yeast rice - took x 2 weeks and then developed same cramping in legs  Family history:   Parents with hyperlipidemia - father on pravastatin  Mother had stroke at 63, also on statin at this time    Diet:   Red meat 1-2 times per month, some cheese; high protein.  Tries to maintain a healthy diet, admits to sweet tooth, but doesn't eat in excess.  Exercise:    Run 20 miles per week. Lift weights or spinning couple of times per week  Labs:   12/30/16:  TC 245, TG 85, HDL 64, LDL 162  No current outpatient medications on file.   No current facility-administered medications for this visit.     Allergies  Allergen Reactions  . Demerol [Meperidine] Nausea Only    Past Medical History:  Diagnosis Date  . Abdominal pregnancy with intrauterine pregnancy   . Gestational diabetes   . Hyperlipemia     There were no vitals taken for this visit.   Hyperlipidemia Patient with elevated cholesterol, last drawn one year ago.  At this point we need to determine if she has familial hyperlipidemia (LDL > 190).  She is willing to try low dose rosuvastatin, so I asked her to wait about 2 weeks until  her leg pains have completely subsided.  She will call once she is feeling better.  Then we will start rosuvastatin 5 mg once weekly and increase by 1 tablet every 4th week as long as she can tolerate.  If her LDL comes back > 190 we can start paperwork to get Repatha covered for her.  Should it be < 190, she currently would not qualify for PCSK-9 based on insurance guidelines.  In that case we would hopefully be able to continue low dose rosuvastatin and consider her for bempedoic acid once that comes on the market in early 2020.     Phillips Hay PharmD CPP [facility]

## 2017-12-22 ENCOUNTER — Encounter: Payer: Self-pay | Admitting: Pharmacist Clinician (PhC)/ Clinical Pharmacy Specialist

## 2017-12-22 NOTE — Assessment & Plan Note (Signed)
Patient with elevated cholesterol, last drawn one year ago.  At this point we need to determine if she has familial hyperlipidemia (LDL > 190).  She is willing to try low dose rosuvastatin, so I asked her to wait about 2 weeks until her leg pains have completely subsided.  She will call once she is feeling better.  Then we will start rosuvastatin 5 mg once weekly and increase by 1 tablet every 4th week as long as she can tolerate.  If her LDL comes back > 190 we can start paperwork to get Repatha covered for her.  Should it be < 190, she currently would not qualify for PCSK-9 based on insurance guidelines.  In that case we would hopefully be able to continue low dose rosuvastatin and consider her for bempedoic acid once that comes on the market in early 2020.

## 2018-01-21 DIAGNOSIS — J322 Chronic ethmoidal sinusitis: Secondary | ICD-10-CM | POA: Diagnosis not present

## 2018-01-21 DIAGNOSIS — J320 Chronic maxillary sinusitis: Secondary | ICD-10-CM | POA: Diagnosis not present

## 2018-01-21 DIAGNOSIS — J301 Allergic rhinitis due to pollen: Secondary | ICD-10-CM | POA: Diagnosis not present

## 2018-01-21 DIAGNOSIS — J370 Chronic laryngitis: Secondary | ICD-10-CM | POA: Diagnosis not present

## 2018-02-03 DIAGNOSIS — J320 Chronic maxillary sinusitis: Secondary | ICD-10-CM | POA: Diagnosis not present

## 2018-02-03 DIAGNOSIS — J322 Chronic ethmoidal sinusitis: Secondary | ICD-10-CM | POA: Diagnosis not present

## 2018-02-08 ENCOUNTER — Other Ambulatory Visit (HOSPITAL_COMMUNITY): Payer: Self-pay | Admitting: Otolaryngology

## 2018-02-21 DIAGNOSIS — E785 Hyperlipidemia, unspecified: Secondary | ICD-10-CM | POA: Diagnosis not present

## 2018-02-21 LAB — LIPID PANEL
CHOL/HDL RATIO: 3.5 ratio (ref 0.0–4.4)
CHOLESTEROL TOTAL: 215 mg/dL — AB (ref 100–199)
HDL: 62 mg/dL (ref >39)
LDL Calculated: 139 mg/dL — ABNORMAL HIGH (ref 0–99)
TRIGLYCERIDES: 68 mg/dL (ref 0–149)
VLDL Cholesterol Cal: 14 mg/dL (ref 5–40)

## 2018-02-21 LAB — HEPATIC FUNCTION PANEL
ALK PHOS: 37 IU/L — AB (ref 39–117)
ALT: 11 IU/L (ref 0–32)
AST: 14 IU/L (ref 0–40)
Albumin: 4 g/dL (ref 3.5–5.5)
Bilirubin Total: 0.5 mg/dL (ref 0.0–1.2)
Bilirubin, Direct: 0.14 mg/dL (ref 0.00–0.40)
Total Protein: 6.4 g/dL (ref 6.0–8.5)

## 2018-02-24 ENCOUNTER — Other Ambulatory Visit (HOSPITAL_COMMUNITY): Payer: Self-pay | Admitting: Otolaryngology

## 2018-02-24 DIAGNOSIS — J322 Chronic ethmoidal sinusitis: Secondary | ICD-10-CM

## 2018-03-07 NOTE — Patient Instructions (Signed)
Start rosuvastatin 5 mg once weekly.  If you tolerate this for 6 weeks, you can increase the dose to 5 mg twice weekly.  If you develop problems please call me at [phone]  Belenda Cruise or Raquel).

## 2018-03-08 ENCOUNTER — Ambulatory Visit (INDEPENDENT_AMBULATORY_CARE_PROVIDER_SITE_OTHER): Payer: Federal, State, Local not specified - PPO | Admitting: Pharmacist Clinician (PhC)/ Clinical Pharmacy Specialist

## 2018-03-08 DIAGNOSIS — E785 Hyperlipidemia, unspecified: Secondary | ICD-10-CM

## 2018-03-08 MED ORDER — ROSUVASTATIN CALCIUM 5 MG PO TABS: ORAL_TABLET | ORAL | 3 refills | Status: DC

## 2018-03-08 NOTE — Progress Notes (Signed)
03/10/2018 Karlton Lemon 26-Dec-1975 [id number]   HPI:  Evelyn Sellers is a 43 y.o. female patient of Dr Jens Som, who presents today for a lipid clinic follow up.   Other than hyperlipidemia, her only other cardiac concern is anginal type pains that occur regularly when she does strenuous activity.  She enjoys running, but finds that she develops chest pain, sometimes radiating down her arm.  She has tried slowing her pace, but the pains will continue until she stops running, at which time they fade away.  She is quite concerned about this and would like further testing to see if there is a cardiovascular cause.  Other than hyperlipidemia, her medical history is significant only for gestational diabetes.  She had a cardiac CT scan which showed a calcium score of 0.    She was unable to tolerate varying doses of atorvastatin, and even tried red yeast rice, but also developed myalgias on that after about 2 weeks of use.    Current Medications:  none  Cholesterol Goals:   LDL < 100  Intolerant/previously tried:  Atorvastatin 20 mg - leg cramping, eased down to 5 mg daily but continued.    Red yeast rice - took x 2 weeks and then developed same cramping in legs  Family history:   Parents with hyperlipidemia - father on pravastatin  Mother had stroke at 66, also on statin at this time    Diet:   Red meat 1-2 times per month, some cheese; high protein.  Tries to maintain a healthy diet, admits to sweet tooth, but doesn't eat in excess.  Exercise:    Run 20 miles per week. Lift weights or spinning couple of times per week  Labs:   02/21/18: TC 215, TG 68, HDL 32, LDL 139  12/30/16:  TC 245, TG 85, HDL 64, LDL 162  Current Outpatient Medications  Medication Sig Dispense Refill  . norethindrone-ethinyl estradiol (JUNEL FE,GILDESS FE,LOESTRIN FE) 1-20 MG-MCG tablet Take 1 tablet by mouth daily.    . rosuvastatin (CRESTOR) 5 MG tablet Take 1 tablet by mouth up to 3 times per week as tolerated 36  tablet 3   No current facility-administered medications for this visit.     Allergies  Allergen Reactions  . Demerol [Meperidine] Nausea Only    Past Medical History:  Diagnosis Date  . Abdominal pregnancy with intrauterine pregnancy   . Gestational diabetes   . Hyperlipemia     There were no vitals taken for this visit.   Hyperlipidemia Patient with family history of hyperlipidemia and intolerance to statin drugs.  Patient has tried atorvastatin 5-20 mg doses without success, also had problems when tried OTC red yeast rice.  She has no current ASCVD, so we will try rosuvastatin 5 mg once weekly.  If she can tolerate this, she can increase to twice weekly after 6 weeks.  She knows to call should she not tolerate it at all.  She is also still concerned about ongoing chest pain that sometimes radiates down left arm.  Only occurs when she is running, and eases off once she stops (slowing her pace does not ease it).  She would like to switch to a female MD, as she feels they would better understand her fears and concerns.  Will have scheduling see if she can be switched to see Dr. Duke Salvia.     Phillips Hay PharmD CPP [facility]

## 2018-03-10 ENCOUNTER — Encounter: Payer: Self-pay | Admitting: Pharmacist Clinician (PhC)/ Clinical Pharmacy Specialist

## 2018-03-10 NOTE — Assessment & Plan Note (Signed)
Patient with family history of hyperlipidemia and intolerance to statin drugs.  Patient has tried atorvastatin 5-20 mg doses without success, also had problems when tried OTC red yeast rice.  She has no current ASCVD, so we will try rosuvastatin 5 mg once weekly.  If she can tolerate this, she can increase to twice weekly after 6 weeks.  She knows to call should she not tolerate it at all.  She is also still concerned about ongoing chest pain that sometimes radiates down left arm.  Only occurs when she is running, and eases off once she stops (slowing her pace does not ease it).  She would like to switch to a female MD, as she feels they would better understand her fears and concerns.  Will have scheduling see if she can be switched to see Dr. Duke Salvia.

## 2018-03-18 ENCOUNTER — Encounter: Payer: Self-pay | Admitting: Cardiovascular Disease

## 2018-03-18 ENCOUNTER — Ambulatory Visit: Payer: Federal, State, Local not specified - PPO | Admitting: Cardiovascular Disease

## 2018-03-18 VITALS — BP 120/72 | HR 52 | Ht 64.0 in | Wt 133.4 lb

## 2018-03-18 DIAGNOSIS — R079 Chest pain, unspecified: Secondary | ICD-10-CM | POA: Diagnosis not present

## 2018-03-18 DIAGNOSIS — E785 Hyperlipidemia, unspecified: Secondary | ICD-10-CM | POA: Diagnosis not present

## 2018-03-18 DIAGNOSIS — Z5181 Encounter for therapeutic drug level monitoring: Secondary | ICD-10-CM

## 2018-03-18 MED ORDER — IBUPROFEN 800 MG PO TABS: 800.0000 mg | ORAL_TABLET | Freq: Three times a day (TID) | ORAL | 0 refills | Status: DC

## 2018-03-18 NOTE — Progress Notes (Signed)
Cardiology Office Note   Date:  03/18/2018   ID:  Evelyn Sellers, DOB [birth year], MRN [id number]  PCP:  Patient, No Pcp Per  Cardiologist:   Evelyn Latch, MD   No chief complaint on file.    History of Present Illness: Evelyn Sellers is a 43 y.o. female with hyperlipidemia who presents for follow up on chest pain.  Evelyn Sellers reports exertional chest pain that has been ongoing for several years.  She runs 6 days/week.  When she gets to mild 2 or 3 she has sharp, substernal chest pain.  She occasionally notes it when taking deep breaths.  The pain does not radiate, though she does sometimes also get some discomfort into her arms.  It continues constantly until she stops.  She feels slightly more short of breath when this occurs but has no nausea.  She first noted it when running sprints up an incline.  She previously saw Evelyn Sellers and had an ETT 03/2015 that was negative for ischemia.  Due to persistent symptoms he referred her for a coronary CT-A 06/2015 that showed a coronary calcium score of 0 and no CAD.  She saw Evelyn Sellers on 08/2017 and reported atypical chest pain.  Given her atypical symptoms and previously normal imaging, no ischemia evaluation was recommended at that time.  Atorvastatin was added to her regimen.  She was unable to tolerate it due to myalgias.  She followed up with our lipid clinic and was started on rosuvastatin.  However she has not yet started this medication.  She mentioned to our pharmacist that she is still concerned about her chest pain and wanted to see a female cardiologist.  She has no lower extremity edema, orthopnea or PND.  She does note that she has been very stressed lately and sometimes gets anxious. She has a family history of CAD and remains concerned about her cardiovascular risk.    Past Medical History:  Diagnosis Date  . Abdominal pregnancy with intrauterine pregnancy   . Gestational diabetes   . Hyperlipemia     Past Surgical History:    Procedure Laterality Date  . ANTERIOR CRUCIATE LIGAMENT REPAIR       Current Outpatient Medications  Medication Sig Dispense Refill  . ibuprofen (ADVIL,MOTRIN) 800 MG tablet Take 1 tablet (800 mg total) by mouth 3 (three) times daily. FOR 2 WEEKS ONLY 42 tablet 0  . norethindrone-ethinyl estradiol (JUNEL FE,GILDESS FE,LOESTRIN FE) 1-20 MG-MCG tablet Take 1 tablet by mouth daily.    . rosuvastatin (CRESTOR) 5 MG tablet Take 1 tablet by mouth up to 3 times per week as tolerated 36 tablet 3   No current facility-administered medications for this visit.     Allergies:   Demerol [meperidine]    Social History:  The patient  reports that she has never smoked. She has never used smokeless tobacco. She reports current alcohol use of about 1.0 standard drinks of alcohol per week. She reports that she does not use drugs.   Family History:  The patient's family history includes Diabetes in her father; Heart attack in her maternal grandfather and maternal grandmother; Hypertension in her maternal grandmother and mother; Stroke in her mother.    ROS:  Please see the history of present illness.   Otherwise, review of systems are positive for none.   All other systems are reviewed and negative.    PHYSICAL EXAM: VS:  BP 120/72 (BP Location: Right Arm, Patient Position: Sitting, Cuff Size: Normal)  Pulse (!) 52   Ht 5' 4" (1.626 m)   Wt 133 lb 6.4 oz (60.5 kg)   LMP 02/08/2018   BMI 22.90 kg/m  , BMI Body mass index is 22.9 kg/m. GENERAL:  Well appearing HEENT:  Pupils equal round and reactive, fundi not visualized, oral mucosa unremarkable NECK:  No jugular venous distention, waveform within normal limits, carotid upstroke brisk and symmetric, no bruits LUNGS:  Clear to auscultation bilaterally HEART:  RRR.  PMI not displaced or sustained,S1 and S2 within normal limits, no S3, no S4, no clicks, no rubs, no murmurs ABD:  Flat, positive bowel sounds normal in frequency in pitch, no bruits,  no rebound, no guarding, no midline pulsatile mass, no hepatomegaly, no splenomegaly EXT:  2 plus pulses throughout, no edema, no cyanosis no clubbing SKIN:  No rashes no nodules NEURO:  Cranial nerves II through XII grossly intact, motor grossly intact throughout PSYCH:  Cognitively intact, oriented to person place and time    EKG:  EKG is ordered today. The ekg ordered today demonstrates sinus bradycardia.  Rate 52 bpm.     Recent Labs: 02/21/2018: ALT 11    Lipid Panel    Component Value Date/Time   CHOL 215 (H) 02/21/2018 0934   TRIG 68 02/21/2018 0934   HDL 62 02/21/2018 0934   CHOLHDL 3.5 02/21/2018 0934   CHOLHDL 3.6 06/20/2015 1034   VLDL 16 06/20/2015 1034   LDLCALC 139 (H) 02/21/2018 0934      Wt Readings from Last 3 Encounters:  03/18/18 133 lb 6.4 oz (60.5 kg)  09/09/17 120 lb (54.4 kg)  05/24/15 136 lb (61.7 kg)      ASSESSMENT AND PLAN:  # Atypical chest pain: Evelyn Sellers continues to have exertional chest pain.  Her symptoms are unchanged from when they first started in 2017.  She had a normal ETT and coronary CT-A with a calcium score of 0.  We discussed the fact that these findings ar every reassuring that her symptoms are not due to ischemia.  It is possible that she has musculoskeletal pain.  There was no pericardial thickening on CT and her EKG is not suggestive of pericarditis.  We will check an ESR and TSH today.  We will do a trial of Ibuprofen 864m tid x2 weeks for possible costochondritis.  No additional ischemia evaluation is indicated.   # Hyperlipidemia:  She will start taking rosuvastatin.  Check lipids/CMP in 2 months.    Time spent: 30 minutes-Greater than 50% of this time was spent in counseling, explanation of diagnosis, planning of further management, and coordination of care.  Current medicines are reviewed at length with the patient today.  The patient does not have concerns regarding medicines.  The following changes have been  made:  no change  Labs/ tests ordered today include:   Orders Placed This Encounter  Procedures  . Sed Rate (ESR)  . TSH  . Lipid panel  . Comprehensive metabolic panel  . EKG 12-Lead     Disposition:   FU with Jahmel Flannagan C. ROval Linsey MD, F[facility] Centerin 3 months.     Signed, Cordarius Benning C. ROval Linsey MD, FCapital Orthopedic Surgery [facility] LLC 03/18/2018 12:12 PM    CB[facility]

## 2018-03-18 NOTE — Patient Instructions (Signed)
Medication Instructions:  START IBUPROFEN 800 MG THREE TIMES A DAY FOR 2 WEEKS ONLY  START THE ROSUVASTATIN   If you need a refill on your cardiac medications before your next appointment, please call your pharmacy.   Lab work: SED RATE/TSH TODAY   FASTING LP/CMET IN 2 MONTHS   If you have labs (blood work) drawn today and your tests are completely normal, you will receive your results only by: Marland Kitchen MyChart Message (if you have MyChart) OR . A paper copy in the mail If you have any lab test that is abnormal or we need to change your treatment, we will call you to review the results.  Testing/Procedures: NONE  Follow-Up: At [facility], you and your health needs are our priority.  As part of our continuing mission to provide you with exceptional heart care, we have created designated Provider Care Teams.  These Care Teams include your primary Cardiologist (physician) and Advanced Practice Providers (APPs -  Physician Assistants and Nurse Practitioners) who all work together to provide you with the care you need, when you need it. You will need a follow up appointment in 3 months.  Please call our office 2 months in advance to schedule this appointment.  You may see DR [facility] or one of the following Advanced Practice Providers on your designated Care Team:   Corine Shelter, PA-C Judy Pimple, [place] . Marjie Skiff, PA-C

## 2018-03-19 LAB — TSH: TSH: 2.19 u[IU]/mL (ref 0.450–4.500)

## 2018-03-19 LAB — SEDIMENTATION RATE: Sed Rate: 2 mm/hr (ref 0–32)

## 2018-04-04 DIAGNOSIS — F411 Generalized anxiety disorder: Secondary | ICD-10-CM | POA: Diagnosis not present

## 2018-04-15 DIAGNOSIS — M545 Low back pain: Secondary | ICD-10-CM | POA: Diagnosis not present

## 2018-04-21 DIAGNOSIS — H66002 Acute suppurative otitis media without spontaneous rupture of ear drum, left ear: Secondary | ICD-10-CM | POA: Diagnosis not present

## 2018-04-21 DIAGNOSIS — R05 Cough: Secondary | ICD-10-CM | POA: Diagnosis not present

## 2018-04-21 DIAGNOSIS — J029 Acute pharyngitis, unspecified: Secondary | ICD-10-CM | POA: Diagnosis not present

## 2018-04-25 DIAGNOSIS — Z6822 Body mass index (BMI) 22.0-22.9, adult: Secondary | ICD-10-CM | POA: Diagnosis not present

## 2018-04-25 DIAGNOSIS — Z01419 Encounter for gynecological examination (general) (routine) without abnormal findings: Secondary | ICD-10-CM | POA: Diagnosis not present

## 2018-04-25 DIAGNOSIS — Z1212 Encounter for screening for malignant neoplasm of rectum: Secondary | ICD-10-CM | POA: Diagnosis not present

## 2018-04-25 DIAGNOSIS — Z1231 Encounter for screening mammogram for malignant neoplasm of breast: Secondary | ICD-10-CM | POA: Diagnosis not present

## 2018-04-28 DIAGNOSIS — F411 Generalized anxiety disorder: Secondary | ICD-10-CM | POA: Diagnosis not present

## 2018-04-29 DIAGNOSIS — J320 Chronic maxillary sinusitis: Secondary | ICD-10-CM | POA: Diagnosis not present

## 2018-04-29 DIAGNOSIS — J322 Chronic ethmoidal sinusitis: Secondary | ICD-10-CM | POA: Diagnosis not present

## 2018-04-29 DIAGNOSIS — J321 Chronic frontal sinusitis: Secondary | ICD-10-CM | POA: Diagnosis not present

## 2018-04-29 DIAGNOSIS — J301 Allergic rhinitis due to pollen: Secondary | ICD-10-CM | POA: Diagnosis not present

## 2018-05-02 DIAGNOSIS — F411 Generalized anxiety disorder: Secondary | ICD-10-CM | POA: Diagnosis not present

## 2018-05-02 DIAGNOSIS — E782 Mixed hyperlipidemia: Secondary | ICD-10-CM | POA: Diagnosis not present

## 2018-05-02 DIAGNOSIS — Z6823 Body mass index (BMI) 23.0-23.9, adult: Secondary | ICD-10-CM | POA: Diagnosis not present

## 2018-05-02 DIAGNOSIS — R002 Palpitations: Secondary | ICD-10-CM | POA: Diagnosis not present

## 2018-05-10 ENCOUNTER — Ambulatory Visit (HOSPITAL_COMMUNITY): Payer: Federal, State, Local not specified - PPO

## 2018-06-10 ENCOUNTER — Telehealth: Payer: Self-pay

## 2018-06-10 NOTE — Telephone Encounter (Signed)
LEFT A MESSAGE TO CALL BACK

## 2018-06-14 ENCOUNTER — Ambulatory Visit: Payer: Federal, State, Local not specified - PPO | Admitting: Cardiovascular Disease

## 2018-06-23 DIAGNOSIS — J309 Allergic rhinitis, unspecified: Secondary | ICD-10-CM | POA: Diagnosis not present

## 2018-06-23 DIAGNOSIS — Z719 Counseling, unspecified: Secondary | ICD-10-CM | POA: Diagnosis not present

## 2018-06-23 DIAGNOSIS — F411 Generalized anxiety disorder: Secondary | ICD-10-CM | POA: Diagnosis not present

## 2018-06-23 DIAGNOSIS — F341 Dysthymic disorder: Secondary | ICD-10-CM | POA: Diagnosis not present

## 2018-07-18 DIAGNOSIS — D235 Other benign neoplasm of skin of trunk: Secondary | ICD-10-CM | POA: Diagnosis not present

## 2018-07-18 DIAGNOSIS — D225 Melanocytic nevi of trunk: Secondary | ICD-10-CM | POA: Diagnosis not present

## 2018-07-18 DIAGNOSIS — L820 Inflamed seborrheic keratosis: Secondary | ICD-10-CM | POA: Diagnosis not present

## 2018-07-18 DIAGNOSIS — D485 Neoplasm of uncertain behavior of skin: Secondary | ICD-10-CM | POA: Diagnosis not present

## 2018-08-10 DIAGNOSIS — J309 Allergic rhinitis, unspecified: Secondary | ICD-10-CM | POA: Diagnosis not present

## 2018-08-10 DIAGNOSIS — J019 Acute sinusitis, unspecified: Secondary | ICD-10-CM | POA: Diagnosis not present

## 2018-08-10 DIAGNOSIS — E782 Mixed hyperlipidemia: Secondary | ICD-10-CM | POA: Diagnosis not present

## 2018-08-10 DIAGNOSIS — F411 Generalized anxiety disorder: Secondary | ICD-10-CM | POA: Diagnosis not present

## 2018-09-06 ENCOUNTER — Encounter: Payer: Self-pay | Admitting: Cardiology

## 2018-09-06 ENCOUNTER — Telehealth: Payer: Self-pay

## 2018-09-06 ENCOUNTER — Telehealth (INDEPENDENT_AMBULATORY_CARE_PROVIDER_SITE_OTHER): Payer: Federal, State, Local not specified - PPO | Admitting: Cardiology

## 2018-09-06 VITALS — Ht 64.0 in | Wt 128.0 lb

## 2018-09-06 DIAGNOSIS — E785 Hyperlipidemia, unspecified: Secondary | ICD-10-CM

## 2018-09-06 DIAGNOSIS — Z8249 Family history of ischemic heart disease and other diseases of the circulatory system: Secondary | ICD-10-CM | POA: Insufficient documentation

## 2018-09-06 DIAGNOSIS — M189 Osteoarthritis of first carpometacarpal joint, unspecified: Secondary | ICD-10-CM | POA: Diagnosis not present

## 2018-09-06 DIAGNOSIS — J309 Allergic rhinitis, unspecified: Secondary | ICD-10-CM | POA: Diagnosis not present

## 2018-09-06 DIAGNOSIS — F341 Dysthymic disorder: Secondary | ICD-10-CM | POA: Diagnosis not present

## 2018-09-06 DIAGNOSIS — F411 Generalized anxiety disorder: Secondary | ICD-10-CM | POA: Diagnosis not present

## 2018-09-06 MED ORDER — OMEGA-3-ACID ETHYL ESTERS 1 G PO CAPS: 1.0000 g | ORAL_CAPSULE | Freq: Every day | ORAL | 6 refills | Status: DC

## 2018-09-06 NOTE — Progress Notes (Signed)
Virtual Visit via Video Note   This visit type was conducted due to national recommendations for restrictions regarding the COVID-19 Pandemic (e.g. social distancing) in an effort to limit this patient's exposure and mitigate transmission in our community.  Due to her co-morbid illnesses, this patient is at least at moderate risk for complications without adequate follow up.  This format is felt to be most appropriate for this patient at this time.  All issues noted in this document were discussed and addressed.  A limited physical exam was performed with this format.  Please refer to the patient's chart for her consent to telehealth for [facility].   Date:  09/06/2018   ID:  Evelyn Sellers, DOB 1975-03-04, MRN [id number]  Patient Location: Home Provider Location: Office  PCP:  Fanny Bien, MD  Cardiologist:  Dr Oval Linsey Electrophysiologist:  None   Evaluation Performed:  Follow-Up Visit  Chief Complaint:  Discomfort with statin Rx  History of Present Illness:    Evelyn Sellers is a pleasant 43 y.o. female with a history of dyslipidemia, LDL 133 in Dec 2019.  She has tried Lipitor and Crestor 3 x week but was unable to tolerate this secondary to myalgias. She has a FM Hx of CAD (grandmother) and wants to do all she can to prevent CAD.  She had a 0 Ca++ score and no CAD by CTA in 2017.  She is active and eats a generally healthy diet.  She denies unusual chest pain.   The patient does not have symptoms concerning for COVID-19 infection (fever, chills, cough, or new shortness of breath).    Past Medical History:  Diagnosis Date  . Abdominal pregnancy with intrauterine pregnancy   . Gestational diabetes   . Hyperlipemia    Past Surgical History:  Procedure Laterality Date  . ANTERIOR CRUCIATE LIGAMENT REPAIR       Current Meds  Medication Sig  . ibuprofen (ADVIL,MOTRIN) 800 MG tablet Take 1 tablet (800 mg total) by mouth 3 (three) times daily. FOR 2 WEEKS ONLY  .  norethindrone-ethinyl estradiol (JUNEL FE,GILDESS FE,LOESTRIN FE) 1-20 MG-MCG tablet Take 1 tablet by mouth daily.     Allergies:   Demerol [meperidine] and Statins   Social History   Tobacco Use  . Smoking status: Never Smoker  . Smokeless tobacco: Never Used  Substance Use Topics  . Alcohol use: Yes    Alcohol/week: 1.0 standard drinks    Types: 1 Standard drinks or equivalent per week    Comment: Occasional  . Drug use: No     Family Hx: The patient's family history includes Diabetes in her father; Heart attack in her maternal grandfather and maternal grandmother; Hypertension in her maternal grandmother and mother; Stroke in her mother.  ROS:   Please see the history of present illness.    All other systems reviewed and are negative.   Prior CV studies:   The following studies were reviewed today:  Coronary CTA 2017  Labs/Other Tests and Data Reviewed:    EKG:  No ECG reviewed.  Recent Labs: 02/21/2018: ALT 11 03/18/2018: TSH 2.190   Recent Lipid Panel Lab Results  Component Value Date/Time   CHOL 215 (H) 02/21/2018 09:34 AM   TRIG 68 02/21/2018 09:34 AM   HDL 62 02/21/2018 09:34 AM   CHOLHDL 3.5 02/21/2018 09:34 AM   CHOLHDL 3.6 06/20/2015 10:34 AM   LDLCALC 139 (H) 02/21/2018 09:34 AM    Wt Readings from Last 3 Encounters:  09/06/18 128  lb (58.1 kg)  03/18/18 133 lb 6.4 oz (60.5 kg)  09/09/17 120 lb (54.4 kg)     Objective:    Vital Signs:  Ht 5\' 4"  (1.626 m)   Wt 128 lb (58.1 kg)   BMI 21.97 kg/m    VITAL SIGNS:  reviewed  ASSESSMENT & PLAN:    Dyslipidemia- LDL 133 in Dec 2019. Statin intolerance- myalgias.  I suggested she try OTC Red Yeast Rice. She would like an Rx for fish oil and I provided an Rx for Lovaza 1gm daily.  I reassured her that besides her LDL being above goal she appeared to be healthy and I encouraged her to continue to live a healthy lifestyle and not dwell too much on the possibility developing CAD. Based on previous B/P  readings her risk of a cardiac event in the next 10 years is 0.5%. She'll see us PRN.   COVID-19 Education: The signs and symptoms of COVID-19 were discussed with the patient and how to seek care for testing (follow up with PCP or arrange E-visit).  The importance of social distancing was discussed today.  Time:   Today, I have spent 15 minutes with the patient with telehealth technology discussing the above problems.     Medication Adjustments/Labs and Tests Ordered: Current medicines are reviewed at length with the patient today.  Concerns regarding medicines are outlined above.   Tests Ordered: No orders of the defined types were placed in this encounter.   Medication Changes: No orders of the defined types were placed in this encounter.   Follow Up:  Virtual Visit or In Person prn  Signed, Corine ShelterLuke Jayonna Meyering, Cordelia Poche-C  09/06/2018 3:44 PM    [facility]

## 2018-09-06 NOTE — Patient Instructions (Addendum)
Medication Instructions:  START Lovaza take 1 tablet once a day  If you need a refill on your cardiac medications before your next appointment, please call your pharmacy.   Lab work: None  If you have labs (blood work) drawn today and your tests are completely normal, you will receive your results only by: Marland Kitchen MyChart Message (if you have MyChart) OR . A paper copy in the mail If you have any lab test that is abnormal or we need to change your treatment, we will call you to review the results.  Testing/Procedures: None   Follow-Up: At [facility], you and your health needs are our priority.  As part of our continuing mission to provide you with exceptional heart care, we have created designated Provider Care Teams.  These Care Teams include your primary Cardiologist (physician) and Advanced Practice Providers (APPs -  Physician Assistants and Nurse Practitioners) who all work together to provide you with the care you need, when you need it. You will need a follow up with Dr Skeet Latch on a AS NEEDED BASIS  Any Other Special Instructions Will Be Listed Below (If Applicable).

## 2018-09-06 NOTE — Telephone Encounter (Signed)
Contacted patient to discuss AVS Instructions. Gave patient Luke's recommendations from today's virtual office visit. Patient voiced understanding and AVS mailed.    

## 2018-09-27 DIAGNOSIS — Z130 Encounter for screening for diseases of the blood and blood-forming organs and certain disorders involving the immune mechanism: Secondary | ICD-10-CM | POA: Diagnosis not present

## 2018-09-27 DIAGNOSIS — E785 Hyperlipidemia, unspecified: Secondary | ICD-10-CM | POA: Diagnosis not present

## 2018-09-27 DIAGNOSIS — Z131 Encounter for screening for diabetes mellitus: Secondary | ICD-10-CM | POA: Diagnosis not present

## 2018-09-27 DIAGNOSIS — Z1321 Encounter for screening for nutritional disorder: Secondary | ICD-10-CM | POA: Diagnosis not present

## 2018-09-27 DIAGNOSIS — Z13228 Encounter for screening for other metabolic disorders: Secondary | ICD-10-CM | POA: Diagnosis not present

## 2018-09-27 DIAGNOSIS — Z1329 Encounter for screening for other suspected endocrine disorder: Secondary | ICD-10-CM | POA: Diagnosis not present

## 2018-09-29 DIAGNOSIS — F411 Generalized anxiety disorder: Secondary | ICD-10-CM | POA: Diagnosis not present

## 2018-09-29 DIAGNOSIS — M722 Plantar fascial fibromatosis: Secondary | ICD-10-CM | POA: Diagnosis not present

## 2018-09-29 DIAGNOSIS — F900 Attention-deficit hyperactivity disorder, predominantly inattentive type: Secondary | ICD-10-CM | POA: Diagnosis not present

## 2018-09-29 DIAGNOSIS — G5603 Carpal tunnel syndrome, bilateral upper limbs: Secondary | ICD-10-CM | POA: Diagnosis not present

## 2018-10-17 DIAGNOSIS — M65871 Other synovitis and tenosynovitis, right ankle and foot: Secondary | ICD-10-CM | POA: Diagnosis not present

## 2018-10-17 DIAGNOSIS — M65872 Other synovitis and tenosynovitis, left ankle and foot: Secondary | ICD-10-CM | POA: Diagnosis not present

## 2018-10-17 DIAGNOSIS — M722 Plantar fascial fibromatosis: Secondary | ICD-10-CM | POA: Diagnosis not present

## 2018-10-24 DIAGNOSIS — F411 Generalized anxiety disorder: Secondary | ICD-10-CM | POA: Diagnosis not present

## 2018-10-24 DIAGNOSIS — G5603 Carpal tunnel syndrome, bilateral upper limbs: Secondary | ICD-10-CM | POA: Diagnosis not present

## 2018-10-24 DIAGNOSIS — M722 Plantar fascial fibromatosis: Secondary | ICD-10-CM | POA: Diagnosis not present

## 2018-10-26 DIAGNOSIS — M1611 Unilateral primary osteoarthritis, right hip: Secondary | ICD-10-CM | POA: Diagnosis not present

## 2018-10-27 ENCOUNTER — Other Ambulatory Visit: Payer: Self-pay

## 2018-10-27 ENCOUNTER — Encounter: Payer: Self-pay | Admitting: Emergency Medicine

## 2018-10-27 ENCOUNTER — Ambulatory Visit (INDEPENDENT_AMBULATORY_CARE_PROVIDER_SITE_OTHER): Payer: Federal, State, Local not specified - PPO | Admitting: Emergency Medicine

## 2018-10-27 DIAGNOSIS — R072 Precordial pain: Secondary | ICD-10-CM

## 2018-10-27 DIAGNOSIS — R918 Other nonspecific abnormal finding of lung field: Secondary | ICD-10-CM | POA: Insufficient documentation

## 2018-10-27 NOTE — Assessment & Plan Note (Signed)
Likely granulomatous disease.  Benign based on size and stability on serial films.  She should not need any repeat scan

## 2018-10-27 NOTE — Patient Instructions (Signed)
We will perform pulmonary function testing at your next office visit Follow with Dr. Lamonte Sakai on the same day to review the results together.

## 2018-10-27 NOTE — Progress Notes (Signed)
Subjective:    Patient ID: Evelyn Sellers, female    DOB: 1975/07/27, 43 y.o.   MRN: [id number]  HPI 43 year old never smoker with a history of hyperlipidemia, has been followed by cardiology for chest discomfort.  She presents today for further evaluation.  She had exercise tolerance test that was reassuring in January 2017. She has a hx benign pulmonary nodular disease.   She reports that she experiences CP - started around 2016, happens with her running. Is L-mid chest, deep inside. No GERD sx.    Review of Systems  Constitutional: Negative for fever and unexpected weight change.  HENT: Negative for congestion, dental problem, ear pain, nosebleeds, postnasal drip, rhinorrhea, sinus pressure, sneezing, sore throat and trouble swallowing.   Eyes: Negative for redness and itching.  Respiratory: Negative for cough, chest tightness, shortness of breath and wheezing.   Cardiovascular: Negative for palpitations and leg swelling.  Gastrointestinal: Negative for nausea and vomiting.  Genitourinary: Negative for dysuria.  Musculoskeletal: Negative for joint swelling.  Skin: Negative for rash.  Neurological: Negative for headaches.  Hematological: Does not bruise/bleed easily.  Psychiatric/Behavioral: Negative for dysphoric mood. The patient is not nervous/anxious.     Past Medical History:  Diagnosis Date  . Abdominal pregnancy with intrauterine pregnancy   . Gestational diabetes   . Hyperlipemia      Family History  Problem Relation Age of Onset  . Hypertension Mother   . Stroke Mother   . Diabetes Father   . Hypertension Maternal Grandmother   . Heart attack Maternal Grandmother   . Heart attack Maternal Grandfather        4X     Social History   Socioeconomic History  . Marital status: Divorced    Spouse name: Not on file  . Number of children: 1  . Years of education: Not on file  . 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 Financial resource strain: Not on file  . Food insecurity    Worry: Not on file    Inability: Not on file  . Transportation needs    Medical: Not on file    Non-medical: Not on file  Tobacco Use  . Smoking status: Never Smoker  . Smokeless tobacco: Never Used  Substance and Sexual Activity  . Alcohol use: Yes    Alcohol/week: 1.0 standard drinks    Types: 1 Standard drinks or equivalent per week    Comment: Occasional  . Drug use: No  . Sexual activity: Not on file  Lifestyle  . Physical activity    Days per week: Not on file    Minutes per session: Not on file  . Stress: Not on file  Relationships  . Social Herbalist on phone: Not on file    Gets together: Not on file    Attends religious service: Not on file    Active member of club or organization: Not on file    Attends meetings of clubs or organizations: Not on file    Relationship status: Not on file  . Intimate partner violence    Fear of current or ex partner: Not on file    Emotionally abused: Not on file    Physically abused: Not on file    Forced sexual activity: Not on file  Other Topics Concern  . Not on file  Social History Narrative  . Not on file  [place], [place], [place], [place],   Allergies  Allergen Reactions  . Demerol [Meperidine] Nausea Only  . Statins Other (See Comments)    Myalgia     Outpatient Medications Prior to Visit  Medication Sig Dispense Refill  . cetirizine (ZYRTEC) 10 MG tablet Take 10 mg by mouth daily.    Marland Kitchen. ibuprofen (ADVIL,MOTRIN) 800 MG tablet Take 1 tablet (800 mg total) by mouth 3 (three) times daily. FOR 2 WEEKS ONLY 42 tablet 0  . norethindrone-ethinyl estradiol (JUNEL FE,GILDESS FE,LOESTRIN FE) 1-20 MG-MCG tablet Take 1 tablet by mouth daily.    Marland Kitchen. omega-3 acid ethyl esters (LOVAZA) 1 g capsule Take 1 capsule (1 g total) by mouth daily. 30 capsule 6  . rosuvastatin (CRESTOR) 5 MG tablet Take 1 tablet by mouth up to 3 times  per week as tolerated 36 tablet 3   No facility-administered medications prior to visit.         Objective:   Physical Exam Vitals:   10/27/18 1410  BP: 120/78  Pulse: 78  SpO2: 98%  Weight: 134 lb (60.8 kg)  Height: 5\' 3"  (1.6 m)   Gen: Pleasant, well-nourished, in no distress,  normal affect  ENT: No lesions,  mouth clear,  oropharynx clear, no postnasal drip  Neck: No JVD, no stridor  Lungs: No use of accessory muscles, no crackles or wheezing on normal respiration, no wheeze on forced expiration  Cardiovascular: RRR, heart sounds normal, no murmur or gallops, no peripheral edema  Abdomen: soft and NT, no HSM,  BS normal  Musculoskeletal: No deformities, no cyanosis or clubbing. Pain is not reproducible w palpation  Neuro: alert, awake, non focal  Skin: Warm, no lesions or rash      Assessment & Plan:  Chest pain No evidence for cardiac process based on her work-up so far.  She denies GERD.  It reliably happens with running, question whether this is a large airway irritation in the setting of dry air and rapid inspiration.  Discussed with her today that although I would expect cough and/or dyspnea, it is possible that this could be exercise-induced bronchospasm or asthma.  I think will be reasonable to check pulmonary function testing to evaluate further.  She agrees.  We will arrange.  Pulmonary nodules/lesions, multiple Likely granulomatous disease.  Benign based on size and stability on serial films.  She should not need any repeat scan  Levy Pupaobert Adael Culbreath, MD, PhD 10/27/2018, 3:01 PM [facility] [phone] or if no answer [phone]

## 2018-10-27 NOTE — Assessment & Plan Note (Signed)
No evidence for cardiac process based on her work-up so far.  She denies GERD.  It reliably happens with running, question whether this is a large airway irritation in the setting of dry air and rapid inspiration.  Discussed with her today that although I would expect cough and/or dyspnea, it is possible that this could be exercise-induced bronchospasm or asthma.  I think will be reasonable to check pulmonary function testing to evaluate further.  She agrees.  We will arrange.

## 2018-11-08 DIAGNOSIS — Z23 Encounter for immunization: Secondary | ICD-10-CM | POA: Diagnosis not present

## 2018-11-08 DIAGNOSIS — R51 Headache: Secondary | ICD-10-CM | POA: Diagnosis not present

## 2018-11-08 DIAGNOSIS — Z6823 Body mass index (BMI) 23.0-23.9, adult: Secondary | ICD-10-CM | POA: Diagnosis not present

## 2018-11-08 DIAGNOSIS — J309 Allergic rhinitis, unspecified: Secondary | ICD-10-CM | POA: Diagnosis not present

## 2018-11-08 DIAGNOSIS — M79644 Pain in right finger(s): Secondary | ICD-10-CM | POA: Diagnosis not present

## 2018-11-08 DIAGNOSIS — Z0000 Encounter for general adult medical examination without abnormal findings: Secondary | ICD-10-CM | POA: Diagnosis not present

## 2018-11-08 DIAGNOSIS — M79645 Pain in left finger(s): Secondary | ICD-10-CM | POA: Diagnosis not present

## 2018-11-16 DIAGNOSIS — M7742 Metatarsalgia, left foot: Secondary | ICD-10-CM | POA: Diagnosis not present

## 2018-11-16 DIAGNOSIS — Q6689 Other  specified congenital deformities of feet: Secondary | ICD-10-CM | POA: Diagnosis not present

## 2018-11-16 DIAGNOSIS — M7741 Metatarsalgia, right foot: Secondary | ICD-10-CM | POA: Diagnosis not present

## 2018-11-16 DIAGNOSIS — M722 Plantar fascial fibromatosis: Secondary | ICD-10-CM | POA: Diagnosis not present

## 2018-12-06 DIAGNOSIS — Z23 Encounter for immunization: Secondary | ICD-10-CM | POA: Diagnosis not present

## 2018-12-22 DIAGNOSIS — F411 Generalized anxiety disorder: Secondary | ICD-10-CM | POA: Diagnosis not present

## 2018-12-22 DIAGNOSIS — F900 Attention-deficit hyperactivity disorder, predominantly inattentive type: Secondary | ICD-10-CM | POA: Diagnosis not present

## 2018-12-22 DIAGNOSIS — G5603 Carpal tunnel syndrome, bilateral upper limbs: Secondary | ICD-10-CM | POA: Diagnosis not present

## 2018-12-30 DIAGNOSIS — Z13820 Encounter for screening for osteoporosis: Secondary | ICD-10-CM | POA: Diagnosis not present

## 2019-02-02 DIAGNOSIS — J309 Allergic rhinitis, unspecified: Secondary | ICD-10-CM | POA: Diagnosis not present

## 2019-02-02 DIAGNOSIS — J329 Chronic sinusitis, unspecified: Secondary | ICD-10-CM | POA: Diagnosis not present

## 2019-02-02 DIAGNOSIS — R519 Headache, unspecified: Secondary | ICD-10-CM | POA: Diagnosis not present

## 2019-04-19 DIAGNOSIS — Z6821 Body mass index (BMI) 21.0-21.9, adult: Secondary | ICD-10-CM | POA: Diagnosis not present

## 2019-04-19 DIAGNOSIS — F900 Attention-deficit hyperactivity disorder, predominantly inattentive type: Secondary | ICD-10-CM | POA: Diagnosis not present

## 2019-04-19 DIAGNOSIS — J309 Allergic rhinitis, unspecified: Secondary | ICD-10-CM | POA: Diagnosis not present

## 2019-04-19 DIAGNOSIS — B373 Candidiasis of vulva and vagina: Secondary | ICD-10-CM | POA: Diagnosis not present

## 2019-06-13 ENCOUNTER — Other Ambulatory Visit: Payer: Self-pay

## 2019-06-13 ENCOUNTER — Ambulatory Visit (INDEPENDENT_AMBULATORY_CARE_PROVIDER_SITE_OTHER): Payer: 59 | Admitting: Otolaryngology

## 2019-06-13 ENCOUNTER — Encounter (INDEPENDENT_AMBULATORY_CARE_PROVIDER_SITE_OTHER): Payer: Self-pay | Admitting: Otolaryngology

## 2019-06-13 VITALS — Temp 97.9°F

## 2019-06-13 DIAGNOSIS — J0141 Acute recurrent pansinusitis: Secondary | ICD-10-CM

## 2019-06-13 DIAGNOSIS — J310 Chronic rhinitis: Secondary | ICD-10-CM | POA: Diagnosis not present

## 2019-06-13 NOTE — Progress Notes (Signed)
HPI: Evelyn Sellers is a 44 y.o. female who presents for evaluation of "sinus headaches".  She apparently gets sinus infections a couple times every year.  She had previously seen Dr. Ernesto Rutherford who would give her prednisone and the antibiotic usually clindamycin.  She has never had x-rays of her sinuses.  Presently describes headache behind the left eye. She also has history of tension headaches in the forehead area. She has used Flonase and azelastine nasal sprays but these may be out of date and needed refills of the Flonase and azelastine 0.1%.  Past Medical History:  Diagnosis Date  . Abdominal pregnancy with intrauterine pregnancy   . Gestational diabetes   . Hyperlipemia    Past Surgical History:  Procedure Laterality Date  . ANTERIOR CRUCIATE LIGAMENT REPAIR     Social History   Socioeconomic History  . Marital status: Divorced    Spouse name: Not on file  . Number of children: 1  . Years of education: Not on file  . 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 Smoking status: Never Smoker  . Smokeless tobacco: Never Used  Substance and Sexual Activity  . Alcohol use: Yes    Alcohol/week: 1.0 standard drinks    Types: 1 Standard drinks or equivalent per week    Comment: Occasional  . Drug use: No  . Sexual activity: Not on file  Other Topics Concern  . Not on file  Social History Narrative  . Not on file   Social Determinants of Health   Financial Resource Strain:   . Difficulty of Paying Living Expenses:   Food Insecurity:   . Worried About Charity fundraiser in the Last Year:   . Arboriculturist in the Last Year:   Transportation Needs:   . Film/video editor (Medical):   Marland Kitchen Lack of Transportation (Non-Medical):   Physical Activity:   . Days of Exercise per Week:   . Minutes of Exercise per Session:   Stress:   . Feeling of Stress :   Social Connections:   . Frequency of Communication with  Friends and Family:   . Frequency of Social Gatherings with Friends and Family:   . Attends Religious Services:   . Active Member of Clubs or Organizations:   . Attends Archivist Meetings:   Marland Kitchen Marital Status:    Family History  Problem Relation Age of Onset  . Hypertension Mother   . Stroke Mother   . Diabetes Father   . Hypertension Maternal Grandmother   . Heart attack Maternal Grandmother   . Heart attack Maternal Grandfather        4X   Allergies  Allergen Reactions  . Demerol [Meperidine] Nausea Only  . Statins Other (See Comments)    Myalgia   Prior to Admission medications   Medication Sig Start Date End Date Taking? Authorizing Provider  cetirizine (ZYRTEC) 10 MG tablet Take 10 mg by mouth daily.   Yes [provider]  ibuprofen (ADVIL,MOTRIN) 800 MG tablet Take 1 tablet (800 mg total) by mouth 3 (three) times daily. FOR 2 WEEKS ONLY 03/18/18  Yes Skeet Latch, MD  norethindrone-ethinyl estradiol (JUNEL FE,GILDESS FE,LOESTRIN FE) 1-20 MG-MCG tablet Take 1 tablet by mouth daily.   Yes [provider]  omega-3 acid ethyl esters (LOVAZA) 1 g capsule Take 1 capsule (1 g total) by mouth daily. 09/06/18  Yes Erlene Quan,  PA-C     Positive ROS: Otherwise negative  All other systems have been reviewed and were otherwise negative with the exception of those mentioned in the HPI and as above.  Physical Exam: Constitutional: Alert, well-appearing, no acute distress Ears: External ears without lesions or tenderness. Ear canals are clear bilaterally with intact, clear TMs.  Nasal: External nose without lesions. Septum with mild deformity.  Mild rhinitis..  No obvious mucopurulent discharge noted from the middle meatus Oral: Lips and gums without lesions. Tongue and palate mucosa without lesions. Posterior oropharynx clear. Neck: No palpable adenopathy or masses Respiratory: Breathing comfortably  Skin: No facial/neck lesions or rash  noted.  Procedures  Assessment: Chronic rhinitis. History of recurrent sinus infections.  She has done well previously with antibiotics and steroids.  Plan: Refilled her azelastine 0.1% 1 spray twice daily and Flonase 2 sprays each nostril at night. Also prescribed Ceftin 250 mg twice daily for 10 days versus the clindamycin which she has taken in the past which is 3 times daily. Also prescribed prednisone 6-day taper starting with 60 mg. Also prescribed Diflucan 150 mg. If she continues to have recurrent problems discussed with her concerning obtaining a CT scan of the sinuses to see if she has any obstructive problems She will follow-up as needed  Narda Bonds, MD

## 2019-09-25 ENCOUNTER — Telehealth (INDEPENDENT_AMBULATORY_CARE_PROVIDER_SITE_OTHER): Payer: Self-pay

## 2019-09-25 DIAGNOSIS — J329 Chronic sinusitis, unspecified: Secondary | ICD-10-CM | POA: Diagnosis not present

## 2019-11-08 DIAGNOSIS — Z1159 Encounter for screening for other viral diseases: Secondary | ICD-10-CM | POA: Diagnosis not present

## 2019-11-08 DIAGNOSIS — Z0000 Encounter for general adult medical examination without abnormal findings: Secondary | ICD-10-CM | POA: Diagnosis not present

## 2019-11-09 DIAGNOSIS — F411 Generalized anxiety disorder: Secondary | ICD-10-CM | POA: Diagnosis not present

## 2019-11-09 DIAGNOSIS — G4700 Insomnia, unspecified: Secondary | ICD-10-CM | POA: Diagnosis not present

## 2019-11-09 DIAGNOSIS — E782 Mixed hyperlipidemia: Secondary | ICD-10-CM | POA: Diagnosis not present

## 2019-11-09 DIAGNOSIS — F331 Major depressive disorder, recurrent, moderate: Secondary | ICD-10-CM | POA: Diagnosis not present

## 2019-11-09 DIAGNOSIS — Z0000 Encounter for general adult medical examination without abnormal findings: Secondary | ICD-10-CM | POA: Diagnosis not present

## 2019-11-09 DIAGNOSIS — Z23 Encounter for immunization: Secondary | ICD-10-CM | POA: Diagnosis not present

## 2019-11-10 ENCOUNTER — Other Ambulatory Visit: Payer: Self-pay | Admitting: Family Medicine

## 2019-11-10 DIAGNOSIS — Z1231 Encounter for screening mammogram for malignant neoplasm of breast: Secondary | ICD-10-CM

## 2019-11-21 DIAGNOSIS — M722 Plantar fascial fibromatosis: Secondary | ICD-10-CM | POA: Diagnosis not present

## 2019-11-21 DIAGNOSIS — M65872 Other synovitis and tenosynovitis, left ankle and foot: Secondary | ICD-10-CM | POA: Diagnosis not present

## 2019-11-21 DIAGNOSIS — M65871 Other synovitis and tenosynovitis, right ankle and foot: Secondary | ICD-10-CM | POA: Diagnosis not present

## 2019-12-04 ENCOUNTER — Telehealth: Payer: Self-pay | Admitting: Cardiovascular Disease

## 2019-12-04 DIAGNOSIS — E785 Hyperlipidemia, unspecified: Secondary | ICD-10-CM

## 2019-12-04 NOTE — Telephone Encounter (Signed)
Returned call to patient of Dr. Oval Linsey who has not been seen in over 1 year. She states she has been trying dietary modifications for cholesterol - this has not helped lower her cholesterol. She reports she has a fairly active lifestyle and her diet is pretty good.  She is unable to tolerate atorvastatin, red yeast rice due to myalgias; heartburn with cholestoff; per chart review, has also tried crestor  Patient has family history of hyperlipidemia in mom/dad, mom stroke at 64, grandfather w/mulitple MIs  Patient would like to try another oral cholesterol medication - did not seem interested in PCSK9 (not interested in needles)  11/09/2019 Total chol: 270 LDL: 186 TG: 117 HDL: 63 VLDL: 21 K+: 5.3 Alk Phos: 36 AST: 17 ALT: 13

## 2019-12-04 NOTE — Telephone Encounter (Signed)
Pt c/o medication issue:  1. Name of Medication: atorvastatin (LIPITOR) 20 MG tablet [[id number]]    2. How are you currently taking this medication (dosage and times per day)?  Pt is not currently taking this med /  She did not take this med very long because it was giving her leg craps   3. Are you having a reaction (difficulty breathing--STAT)? No   4. What is your medication issue? She stated she needs to try a new Stain per her PCP  .  No other issue just need to keep trying meds to find the right one.    Best number [phone]

## 2019-12-05 NOTE — Telephone Encounter (Signed)
She can try bempidoic acid.  180mg  daily.  Repeat lipids/CMP in 3 months and schedule f/u after that.

## 2019-12-05 NOTE — Telephone Encounter (Signed)
Left message to call back  

## 2019-12-06 MED ORDER — NEXLETOL 180 MG PO TABS: 180.0000 mg | ORAL_TABLET | Freq: Every day | ORAL | 3 refills | Status: DC

## 2019-12-06 NOTE — Telephone Encounter (Signed)
Spoke with the patient and gave her recommendations per Dr. Duke Salvia. The patient will start on bempidoic acid 180 mg daily - Rx has been sent in. She will repeat lab work in 3 months and plan to follow up after that.

## 2019-12-06 NOTE — Telephone Encounter (Signed)
Patient returning call.

## 2019-12-07 ENCOUNTER — Telehealth: Payer: Self-pay | Admitting: Cardiovascular Disease

## 2019-12-07 NOTE — Telephone Encounter (Signed)
PA for nexletol needed

## 2019-12-07 NOTE — Telephone Encounter (Signed)
New message    Pt c/o medication issue:  1. Name of Medication: bempedoic acid  2. How are you currently taking this medication (dosage and times per day)? 180mg   3. Are you having a reaction (difficulty breathing--STAT)? no  4. What is your medication issue? Patient said her pharmacist called her and said this medication will need prior authorization.  Patient request a generic if available.

## 2019-12-08 NOTE — Telephone Encounter (Signed)
Went on covermymeds to start PA and per site [place] does not require PA, will follow up with Pharmacy/patient

## 2019-12-14 NOTE — Telephone Encounter (Signed)
Evelyn Sellers is calling stating the pharmacy still has not received the prescription for this medication. She states she no longer has Evelyn Sellers, but is now using Evelyn Sellers. I transferred Evelyn Sellers to billing to have this updated. Please advise.

## 2019-12-14 NOTE — Telephone Encounter (Signed)
Routed to primary nurse as insurance has changed

## 2019-12-15 NOTE — Telephone Encounter (Signed)
PA started in coverymymeds

## 2019-12-19 ENCOUNTER — Other Ambulatory Visit: Payer: Self-pay

## 2019-12-19 MED ORDER — NEXLETOL 180 MG PO TABS: 180.0000 mg | ORAL_TABLET | Freq: Every day | ORAL | 1 refills | Status: DC

## 2019-12-28 NOTE — Telephone Encounter (Signed)
Received denial on Nexletol Scheduled appointment with Pharm D per Dr Duke Salvia

## 2020-01-01 ENCOUNTER — Other Ambulatory Visit: Payer: Self-pay

## 2020-01-01 ENCOUNTER — Ambulatory Visit (INDEPENDENT_AMBULATORY_CARE_PROVIDER_SITE_OTHER): Payer: BC Managed Care – PPO | Admitting: Pharmacist Clinician (PhC)/ Clinical Pharmacy Specialist

## 2020-01-01 DIAGNOSIS — E785 Hyperlipidemia, unspecified: Secondary | ICD-10-CM

## 2020-01-01 NOTE — Progress Notes (Signed)
01/02/2020 Evelyn Sellers 1976-02-28 [id number]   HPI:  Evelyn Sellers is a 44 y.o. female patient of Dr Duke Salvia, who presents today for a lipid clinic evaluation.  Her cardiac history is significant only for hyperlipidemia and exertional chest pain.  She was seen in CVRR clinic last year for potential PCSK-9i treatment, but at that time had only tried and failed atorvastatin and red yeast rice.  We started her on rosuvastatin 5 mg once weekly with instructions to titrate up by 1 tablet every 5 weeks as tolerated.  She was not able to tolerate this for more than 2-3 months.   Previously, a coronary CT-A in 2017 showed her to have a coronary calcium score of 0.    Current Medications: none  Cholesterol Goals: LDL < 100   Intolerant/previously tried: atorvastatin, rosuvastatin, red yeast rice  Family history: both parents with elevated cholesterol, mother had a stroke at age 85, both parents tolerating statin treatment  Diet: more chicken and [place]; some fish; plenty of vegetables; low carbs  Exercise:  Unable to run right now, still exercising regularly (HIT - cardio and weights)  Labs: 9/21:  TC 270, TG 117, HDL 63, LDL 186 (self reported by patient - have sent request for copy to PCP)   Current Outpatient Medications  Medication Sig Dispense Refill   norethindrone-ethinyl estradiol (JUNEL FE,GILDESS FE,LOESTRIN FE) 1-20 MG-MCG tablet Take 1 tablet by mouth daily.     omega-3 acid ethyl esters (LOVAZA) 1 g capsule Take 1 capsule (1 g total) by mouth daily. 30 capsule 6   No current facility-administered medications for this visit.    Allergies  Allergen Reactions   Atorvastatin     myalgias   Demerol [Meperidine] Nausea Only   Rosuvastatin     myalgias   Statins Other (See Comments)    Myalgia    Past Medical History:  Diagnosis Date   Abdominal pregnancy with intrauterine pregnancy    Gestational diabetes    Hyperlipemia     Blood pressure (!) 124/92, pulse 86,  resp. rate 16, height 5\' 4"  (1.626 m), weight 138 lb 3.2 oz (62.7 kg), last menstrual period 10/17/2019, SpO2 99 %.   Dyslipidemia, goal LDL below 100 Patient with history of high LDL and family history of ASCVD.  Unable to tolerate both atorvastatin and rosuvastatin.  Will not qualify for PCSK-9i or bempedoic acid due to lack of personal ASCVD.  (they are both only approved for secondary prevention).  Will have her try Livalo, as we currently don't have other options.  Samples of 2 mg tablets given and advised to try 1/2 tablet three times weekly.  She should call back in 3-4 weeks to let 10/19/2019 know if she is able to tolerate.  If so, we can start a PA request.  Will reach out to her PCP for lipid records, if she has an LDL at 190 or higher we could then automatically consider her to have familial hyperlipidemia, at which point PCSK-9i therapy would be covered.     [place] PharmD CPP [facility] [address] [place], [place] [phone]

## 2020-01-01 NOTE — Patient Instructions (Signed)
  Medication changes:  Try Livalo 1 mg three days a week (cut the 2 mg tablets in half).  If you develop muscle pains on this, please give [place] a call.   If you can, purchase a home BP cuff (Omron preferred).  Check your blood pressure at home 3-4 mornings each week.  After 2-3 weeks, average the readings.  You want the average to be < 130/80.  If it is above this please give [place] a call   Evelyn Sellers/Evelyn Sellers at [phone]   HOW TO TAKE YOUR BLOOD PRESSURE: . Rest 5 minutes before taking your blood pressure. .  Don't smoke or drink caffeinated beverages for at least 30 minutes before. . Take your blood pressure before (not after) you eat. . Sit comfortably with your back supported and both feet on the floor (don't cross your legs). . Elevate your arm to heart level on a table or a desk. . Use the proper sized cuff. It should fit smoothly and snugly around your bare upper arm. There should be enough room to slip a fingertip under the cuff. The bottom edge of the cuff should be 1 inch above the crease of the elbow. . Ideally, take 3 measurements at one sitting and record the average.    Thank you for choosing [facility]

## 2020-01-02 ENCOUNTER — Encounter: Payer: Self-pay | Admitting: Pharmacist Clinician (PhC)/ Clinical Pharmacy Specialist

## 2020-01-02 NOTE — Assessment & Plan Note (Signed)
Patient with history of high LDL and family history of ASCVD.  Unable to tolerate both atorvastatin and rosuvastatin.  Will not qualify for PCSK-9i or bempedoic acid due to lack of personal ASCVD.  (they are both only approved for secondary prevention).  Will have her try Livalo, as we currently don't have other options.  Samples of 2 mg tablets given and advised to try 1/2 tablet three times weekly.  She should call back in 3-4 weeks to let us know if she is able to tolerate.  If so, we can start a PA request.  Will reach out to her PCP for lipid records, if she has an LDL at 190 or higher we could then automatically consider her to have familial hyperlipidemia, at which point PCSK-9i therapy would be covered.

## 2020-01-08 ENCOUNTER — Telehealth: Payer: Self-pay

## 2020-01-08 DIAGNOSIS — E785 Hyperlipidemia, unspecified: Secondary | ICD-10-CM

## 2020-01-08 MED ORDER — REPATHA SURECLICK 140 MG/ML ~~LOC~~ SOAJ: 140.0000 mg | SUBCUTANEOUS | 11 refills | Status: DC

## 2020-01-08 NOTE — Telephone Encounter (Signed)
Called and lmomed the pt that they are to start the repatha, rx sent, instructed the pt to complete labs after 4th dose, and to call us so that I can get them signed up for a copay card

## 2020-02-09 DIAGNOSIS — M1611 Unilateral primary osteoarthritis, right hip: Secondary | ICD-10-CM | POA: Diagnosis not present

## 2020-03-06 DIAGNOSIS — U071 COVID-19: Secondary | ICD-10-CM | POA: Diagnosis not present

## 2020-03-06 DIAGNOSIS — J329 Chronic sinusitis, unspecified: Secondary | ICD-10-CM | POA: Diagnosis not present

## 2020-03-06 DIAGNOSIS — J069 Acute upper respiratory infection, unspecified: Secondary | ICD-10-CM | POA: Diagnosis not present

## 2020-03-11 DIAGNOSIS — U071 COVID-19: Secondary | ICD-10-CM | POA: Diagnosis not present

## 2020-04-17 DIAGNOSIS — M25551 Pain in right hip: Secondary | ICD-10-CM | POA: Diagnosis not present

## 2020-06-27 DIAGNOSIS — E559 Vitamin D deficiency, unspecified: Secondary | ICD-10-CM | POA: Diagnosis not present

## 2020-06-27 DIAGNOSIS — R638 Other symptoms and signs concerning food and fluid intake: Secondary | ICD-10-CM | POA: Diagnosis not present

## 2020-06-27 DIAGNOSIS — Z6823 Body mass index (BMI) 23.0-23.9, adult: Secondary | ICD-10-CM | POA: Diagnosis not present

## 2020-06-27 DIAGNOSIS — E782 Mixed hyperlipidemia: Secondary | ICD-10-CM | POA: Diagnosis not present

## 2020-07-16 ENCOUNTER — Telehealth (INDEPENDENT_AMBULATORY_CARE_PROVIDER_SITE_OTHER): Payer: Self-pay

## 2020-07-16 ENCOUNTER — Other Ambulatory Visit (INDEPENDENT_AMBULATORY_CARE_PROVIDER_SITE_OTHER): Payer: BC Managed Care – PPO

## 2020-07-16 DIAGNOSIS — J329 Chronic sinusitis, unspecified: Secondary | ICD-10-CM

## 2020-07-16 NOTE — Telephone Encounter (Signed)
Per Dr. Ezzard Standing, i will order ct scan and get pt in for appointment for f/up to review ct scan and then go from there if surgery is need and if so if we can get pt in by 6/31/22.

## 2020-07-23 DIAGNOSIS — E782 Mixed hyperlipidemia: Secondary | ICD-10-CM | POA: Diagnosis not present

## 2020-07-23 DIAGNOSIS — Z6820 Body mass index (BMI) 20.0-20.9, adult: Secondary | ICD-10-CM | POA: Diagnosis not present

## 2020-07-23 DIAGNOSIS — M549 Dorsalgia, unspecified: Secondary | ICD-10-CM | POA: Diagnosis not present

## 2020-07-23 DIAGNOSIS — M545 Low back pain, unspecified: Secondary | ICD-10-CM | POA: Diagnosis not present

## 2020-08-01 ENCOUNTER — Other Ambulatory Visit: Payer: Self-pay

## 2020-08-01 ENCOUNTER — Ambulatory Visit
Admission: RE | Admit: 2020-08-01 | Discharge: 2020-08-01 | Disposition: A | Discharge status: Home / Self Care | Payer: BC Managed Care – PPO | Source: Ambulatory Visit | Attending: Otolaryngology | Admitting: Otolaryngology

## 2020-08-01 DIAGNOSIS — J329 Chronic sinusitis, unspecified: Secondary | ICD-10-CM

## 2020-08-01 DIAGNOSIS — J3489 Other specified disorders of nose and nasal sinuses: Secondary | ICD-10-CM | POA: Diagnosis not present

## 2020-08-01 DIAGNOSIS — J342 Deviated nasal septum: Secondary | ICD-10-CM | POA: Diagnosis not present

## 2020-08-01 DIAGNOSIS — J322 Chronic ethmoidal sinusitis: Secondary | ICD-10-CM | POA: Diagnosis not present

## 2020-08-01 DIAGNOSIS — J0110 Acute frontal sinusitis, unspecified: Secondary | ICD-10-CM | POA: Diagnosis not present

## 2020-08-19 ENCOUNTER — Ambulatory Visit (INDEPENDENT_AMBULATORY_CARE_PROVIDER_SITE_OTHER): Payer: BC Managed Care – PPO | Admitting: Otolaryngology

## 2020-08-19 ENCOUNTER — Other Ambulatory Visit: Payer: Self-pay

## 2020-08-19 DIAGNOSIS — J310 Chronic rhinitis: Secondary | ICD-10-CM | POA: Diagnosis not present

## 2020-08-19 NOTE — Progress Notes (Signed)
HPI: Evelyn Sellers is a 45 y.o. female who returns today for evaluation of sinuses.  She had previously been treated by Dr. Haroldine Laws with recurrent sinus infections.  She is actually doing well today.  She presents today to review a CT scan she had performed 2 weeks ago.  I reviewed the CT scan with her in the office today.  This showed clear paranasal sinuses with a minimal mucosal thickening within the ethmoid area but no obstruction no opacification and no obstruction of the ostia of the sinuses.  Septum is relatively midline..  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[employer]  Tobacco Use   Smoking status: Never   Smokeless tobacco: Never  Substance and Sexual Activity   Alcohol use: Yes    Alcohol/week: 1.0 standard drink    Types: 1 Standard drinks or equivalent per week    Comment: Occasional   Drug use: No   Sexual activity: Not on file  Other Topics Concern   Not on file  Social History Narrative   Not on file   Social Determinants of Health   Financial Resource Strain: Not on file  Food Insecurity: Not on file  Transportation Needs: Not on file  Physical Activity: Not on file  Stress: Not on file  Social Connections: Not on file   Family History  Problem Relation Age of Onset   Hypertension Mother    Stroke Mother    Diabetes Father    Hypertension Maternal Grandmother    Heart attack Maternal Grandmother    Heart attack Maternal Grandfather        4X   Allergies  Allergen Reactions   Atorvastatin     myalgias   Demerol [Meperidine]  Nausea Only   Rosuvastatin     myalgias   Statins Other (See Comments)    Myalgia   Prior to Admission medications   Medication Sig Start Date End Date Taking? Authorizing Provider  Evolocumab (REPATHA SURECLICK) 140 MG/ML SOAJ Inject 140 mg into the skin every 14 (fourteen) days. 01/08/20   Chilton Si, MD  norethindrone-ethinyl estradiol (JUNEL FE,GILDESS FE,LOESTRIN FE) 1-20 MG-MCG tablet Take 1 tablet by mouth daily.    [provider]  omega-3 acid ethyl esters (LOVAZA) 1 g capsule Take 1 capsule (1 g total) by mouth daily. 09/06/18   Abelino Derrick, PA-C     Positive ROS: Otherwise negative  All other systems have been reviewed and were otherwise negative with the exception of those mentioned in the HPI and as above.  Physical Exam: Constitutional: Alert, well-appearing, no acute distress Ears: External ears without lesions or tenderness. Ear canals are clear bilaterally with intact, clear TMs.  Nasal: External nose without lesions. Septum with minimal deformity and mild rhinitis. Clear nasal passages bilaterally. Oral: Lips and gums without lesions. Tongue and palate mucosa without lesions. Posterior oropharynx clear. Neck: No palpable adenopathy or masses Respiratory: Breathing comfortably  Skin: No facial/neck lesions or rash noted.  Procedures  Assessment: Chronic rhinitis No  evidence of chronic sinus disease on recent CT scan.  Plan: Reviewed with her concerning use of nasal steroid spray as well as saline rinses for sinus issues. No indication for surgical intervention. She will follow-up as needed.   Narda Bonds, MD

## 2020-09-06 DIAGNOSIS — M25551 Pain in right hip: Secondary | ICD-10-CM | POA: Diagnosis not present

## 2020-10-22 DIAGNOSIS — G8929 Other chronic pain: Secondary | ICD-10-CM | POA: Diagnosis not present

## 2020-10-22 DIAGNOSIS — F988 Other specified behavioral and emotional disorders with onset usually occurring in childhood and adolescence: Secondary | ICD-10-CM | POA: Diagnosis not present

## 2020-10-22 DIAGNOSIS — M549 Dorsalgia, unspecified: Secondary | ICD-10-CM | POA: Diagnosis not present

## 2020-11-20 DIAGNOSIS — Z13228 Encounter for screening for other metabolic disorders: Secondary | ICD-10-CM | POA: Diagnosis not present

## 2020-11-20 DIAGNOSIS — Z1329 Encounter for screening for other suspected endocrine disorder: Secondary | ICD-10-CM | POA: Diagnosis not present

## 2020-11-20 DIAGNOSIS — Z1321 Encounter for screening for nutritional disorder: Secondary | ICD-10-CM | POA: Diagnosis not present

## 2020-11-20 DIAGNOSIS — E8941 Symptomatic postprocedural ovarian failure: Secondary | ICD-10-CM | POA: Diagnosis not present

## 2020-11-20 DIAGNOSIS — Z131 Encounter for screening for diabetes mellitus: Secondary | ICD-10-CM | POA: Diagnosis not present

## 2020-11-20 DIAGNOSIS — Z130 Encounter for screening for diseases of the blood and blood-forming organs and certain disorders involving the immune mechanism: Secondary | ICD-10-CM | POA: Diagnosis not present

## 2020-11-22 DIAGNOSIS — Z1231 Encounter for screening mammogram for malignant neoplasm of breast: Secondary | ICD-10-CM | POA: Diagnosis not present

## 2020-11-22 DIAGNOSIS — Z113 Encounter for screening for infections with a predominantly sexual mode of transmission: Secondary | ICD-10-CM | POA: Diagnosis not present

## 2020-11-22 DIAGNOSIS — Z6824 Body mass index (BMI) 24.0-24.9, adult: Secondary | ICD-10-CM | POA: Diagnosis not present

## 2020-11-22 DIAGNOSIS — Z01419 Encounter for gynecological examination (general) (routine) without abnormal findings: Secondary | ICD-10-CM | POA: Diagnosis not present

## 2021-01-29 DIAGNOSIS — F988 Other specified behavioral and emotional disorders with onset usually occurring in childhood and adolescence: Secondary | ICD-10-CM | POA: Diagnosis not present

## 2021-01-29 DIAGNOSIS — J329 Chronic sinusitis, unspecified: Secondary | ICD-10-CM | POA: Diagnosis not present

## 2021-01-29 DIAGNOSIS — B9689 Other specified bacterial agents as the cause of diseases classified elsewhere: Secondary | ICD-10-CM | POA: Diagnosis not present

## 2021-01-29 DIAGNOSIS — J069 Acute upper respiratory infection, unspecified: Secondary | ICD-10-CM | POA: Diagnosis not present

## 2021-02-28 ENCOUNTER — Other Ambulatory Visit: Payer: Self-pay

## 2021-02-28 DIAGNOSIS — E785 Hyperlipidemia, unspecified: Secondary | ICD-10-CM

## 2021-04-23 DIAGNOSIS — M25551 Pain in right hip: Secondary | ICD-10-CM | POA: Diagnosis not present

## 2021-04-30 DIAGNOSIS — H00026 Hordeolum internum left eye, unspecified eyelid: Secondary | ICD-10-CM | POA: Diagnosis not present

## 2021-04-30 DIAGNOSIS — F988 Other specified behavioral and emotional disorders with onset usually occurring in childhood and adolescence: Secondary | ICD-10-CM | POA: Diagnosis not present

## 2021-04-30 DIAGNOSIS — H00019 Hordeolum externum unspecified eye, unspecified eyelid: Secondary | ICD-10-CM | POA: Diagnosis not present

## 2021-05-28 ENCOUNTER — Encounter (HOSPITAL_BASED_OUTPATIENT_CLINIC_OR_DEPARTMENT_OTHER): Payer: Self-pay | Admitting: Nurse Practitioner

## 2021-05-28 ENCOUNTER — Other Ambulatory Visit: Payer: Self-pay

## 2021-05-28 ENCOUNTER — Ambulatory Visit (HOSPITAL_BASED_OUTPATIENT_CLINIC_OR_DEPARTMENT_OTHER): Payer: BC Managed Care – PPO | Admitting: Nurse Practitioner

## 2021-05-28 VITALS — BP 117/72 | HR 98 | Temp 98.4°F | Ht 64.0 in | Wt 142.4 lb

## 2021-05-28 DIAGNOSIS — R635 Abnormal weight gain: Secondary | ICD-10-CM

## 2021-05-28 DIAGNOSIS — R7989 Other specified abnormal findings of blood chemistry: Secondary | ICD-10-CM

## 2021-05-28 DIAGNOSIS — Z0000 Encounter for general adult medical examination without abnormal findings: Secondary | ICD-10-CM | POA: Diagnosis not present

## 2021-05-28 DIAGNOSIS — F900 Attention-deficit hyperactivity disorder, predominantly inattentive type: Secondary | ICD-10-CM | POA: Diagnosis not present

## 2021-05-28 MED ORDER — METHYLPHENIDATE HCL 20 MG PO TABS: 20.0000 mg | ORAL_TABLET | Freq: Two times a day (BID) | ORAL | 0 refills | Status: DC

## 2021-05-28 MED ORDER — METHYLPHENIDATE HCL 10 MG PO TABS: 20.0000 mg | ORAL_TABLET | Freq: Two times a day (BID) | ORAL | 0 refills | Status: DC

## 2021-05-28 NOTE — Patient Instructions (Signed)
Thank you for choosing [place] at [facility] for your Primary Care needs. I am excited for the opportunity to partner with you to meet your health care goals. It was a pleasure meeting you today! ? ?Recommendations from today's visit: ?We will plan to get some labs in the near future and I will plan to call to you and we can discuss the lab results.  ?We can also discuss the ritalin and see how you are doing.  ? ?Information on diet, exercise, and health maintenance recommendations are listed below. This is information to help you be sure you are on track for optimal health and monitoring.  ? ?Please look over this and let us know if you have any questions or if you have completed any of the health maintenance outside of [place] so that we can be sure your records are up to date.  ?___________________________________________________________ ?About Me: ?I am an Adult-Geriatric Nurse Practitioner with a background in caring for patients for more than 20 years with a strong intensive care background. I provide primary care and sports medicine services to patients age 71 and older within this office. My education had a strong focus on caring for the older adult population, which I am passionate about. I am also the director of the APP Fellowship with [facility].  ? ?My desire is to provide you with the best service through preventive medicine and supportive care. I consider you a part of the medical team and value your input. I work diligently to ensure that you are heard and your needs are met in a safe and effective manner. I want you to feel comfortable with me as your provider and want you to know that your health concerns are important to me. ? ?For your information, our office hours are: ?Monday, Tuesday, and Thursday 8:00 AM - 5:00 PM ?Wednesday and Friday 8:00 AM - 12:00 PM.  ? ?In my time away from the office I am teaching new APP's within the system and am unavailable, but my  partner, Dr. Burnard Bunting is in the office for emergent needs.  ? ?If you have questions or concerns, please call our office at [phone] or send [place] a MyChart message and we will respond as quickly as possible.  ?____________________________________________________________ ?MyChart:  ?For all urgent or time sensitive needs we ask that you please call the office to avoid delays. Our number is [phone]. ?MyChart is not constantly monitored and due to the large volume of messages a day, replies may take up to 72 business hours. ? ?MyChart Policy: ?MyChart allows for you to see your visit notes, after visit summary, provider recommendations, lab and tests results, make an appointment, request refills, and contact your provider or the office for non-urgent questions or concerns. Providers are seeing patients during normal business hours and do not have built in time to review MyChart messages.  ?We ask that you allow a minimum of 3 business days for responses to [employer]. For this reason, please do not send urgent requests through [place]. Please call the office at [phone]. ?New and ongoing conditions may require a visit. We have virtual and in person visit available for your convenience.  ?Complex MyChart concerns may require a visit. Your provider may request you schedule a virtual or in person visit to ensure we are providing the best care possible. ?MyChart messages sent after 11:00 AM on Friday will not be received by the provider until Monday morning.  ?  ?Lab  and Test Results: ?You will receive your lab and test results on MyChart as soon as they are completed and results have been sent by the lab or testing facility. Due to this service, you will receive your results BEFORE your provider.  ?I review lab and tests results each morning prior to seeing patients. Some results require collaboration with other providers to ensure you are receiving the most appropriate care. For this reason, we ask that  you please allow a minimum of 3-5 business days from the time the ALL results have been received for your provider to receive and review lab and test results and contact you about these.  ?Most lab and test result comments from the provider will be sent through [place]. Your provider may recommend changes to the plan of care, follow-up visits, repeat testing, ask questions, or request an office visit to discuss these results. You may reply directly to this message or call the office at [phone] to provide information for the provider or set up an appointment. ?In some instances, you will be called with test results and recommendations. Please let us know if this is preferred and we will make note of this in your chart to provide this for you.    ?If you have not heard a response to your lab or test results in 5 business days from all results returning to [place], please call the office to let us know. We ask that you please avoid calling prior to this time unless there is an emergent concern. Due to high call volumes, this can delay the resulting process. ? ?After Hours: ?For all non-emergency after hours needs, please call the office at [phone] and select the option to reach the on-call provider service. On-call services are shared between multiple [place] offices and therefore it will not be possible to speak directly with your provider. On-call providers may provide medical advice and recommendations, but are unable to provide refills for maintenance medications.  ?For all emergency or urgent medical needs after normal business hours, we recommend that you seek care at the closest Urgent Care or Emergency Department to ensure appropriate treatment in a timely manner.  ?[facility] [place] has a 24 hour emergency room located on the ground floor for your convenience.  ? ?Urgent Concerns During the Business Day ?Providers are seeing patients from 8AM to [place] with a busy schedule and are  most often not able to respond to non-urgent calls until the end of the day or the next business day. ?If you should have URGENT concerns during the day, please call and speak to the nurse or schedule a same day appointment so that we can address your concern without delay.  ? ?Thank you, again, for choosing me as your health care partner. I appreciate your trust and look forward to learning more about you.  ? ?Worthy Keeler, DNP, AGNP-c ?___________________________________________________________ ? ?Health Maintenance Recommendations ?Screening Testing ?Mammogram ?Every 1 -2 years based on history and risk factors ?Starting at age 86 ?Pap Smear ?Ages 21-39 every 3 years ?Ages 68-65 every 5 years with HPV testing ?More frequent testing may be required based on results and history ?Colon Cancer Screening ?Every 1-10 years based on test performed, risk factors, and history ?Starting at age 76 ?Bone Density Screening ?Every 2-10 years based on history ?Starting at age 42 for women ?Recommendations for men differ based on medication usage, history, and risk factors ?AAA Screening ?One time ultrasound ?Men 48-79 years old who have every  smoked ?Lung Cancer Screening ?Low Dose Lung CT every 12 months ?Age 53-80 years with a 30 pack-year smoking history who still smoke or who have quit within the last 15 years ? ?Screening Labs ?Routine  Labs: Complete Blood Count (CBC), Complete Metabolic Panel (CMP), Cholesterol (Lipid Panel) ?Every 6-12 months based on history and medications ?May be recommended more frequently based on current conditions or previous results ?Hemoglobin A1c Lab ?Every 3-12 months based on history and previous results ?Starting at age 35 or earlier with diagnosis of diabetes, high cholesterol, BMI >26, and/or risk factors ?Frequent monitoring for patients with diabetes to ensure blood sugar control ?Thyroid Panel (TSH w/ T3 & T4) ?Every 6 months based on history, symptoms, and risk factors ?May be  repeated more often if on medication ?HIV ?One time testing for all patients 51 and older ?May be repeated more frequently for patients with increased risk factors or exposure ?Hepatitis C ?One time testing for

## 2021-05-28 NOTE — Progress Notes (Signed)
?Tollie Eth, DNP, AGNP-c ?Primary Care & Sports Medicine ?[address]  [address] ?[place], [place] [address] ?[phone] ? ?New patient visit ? ? ?Patient: Evelyn Sellers   DOB: [birth year]   46 y.o. Female  MRN: [id number] ?Visit Date: 05/28/2021 ? ?Patient Care Team: ?Edu On, Sung Amabile, NP as PCP - General (Nurse Practitioner) ?Chilton Si, MD as PCP - Cardiology (Cardiology) ? ?Today's healthcare provider: Tollie Eth, NP  ? ?Chief Complaint  ?Patient presents with  ? Establish Care  ? ?Subjective  ?  ?Evelyn Sellers is a 46 y.o. female who presents today as a new patient to establish care.  ?  ?Patient endorses the following concerns presently: ? ?Weight ?- exercise 1 hour a day ?- eating about 1000 calories a day ?- still unable to loose weight ?- endorses frustration over her efforts and lack of success ?- in the past has had high cortisol levels and high cholesterol ?- no hx of DM or impaired glucose levels that she is aware of ? ?ADHD ?- long term difficulty with attention and focus ?- has not been on medication in quite a while, but feels her focus and attention are causing difficulty with everyday life and ability to complete tasks ?- no hyperactivity present ?- unable to finish started projects, difficulty getting projects started, moving from task to task ? ?History reviewed and reveals the following: ?Past Medical History:  ?Diagnosis Date  ? Abdominal pregnancy with intrauterine pregnancy   ? Abdominal pregnancy with intrauterine pregnancy 03/17/2010  ? Qualifier: Diagnosis of  By: Rodena Medin MD, Acie Fredrickson   ? Gestational diabetes   ? Hyperlipemia   ? ?Past Surgical History:  ?Procedure Laterality Date  ? ANTERIOR CRUCIATE LIGAMENT REPAIR    ? ?Family Status  ?Relation Name Status  ? Mother  Alive  ? Father  Alive  ? MGM  Alive  ? MGF  Deceased  ? PGM  Deceased  ? PGF  Deceased  ? Sister  Alive  ? Son  Alive  ? ?Family History  ?Problem Relation Age of Onset  ?  Hypertension Mother   ? Stroke Mother   ? Diabetes Father   ? Hypertension Maternal Grandmother   ? Heart attack Maternal Grandmother   ? Heart attack Maternal Grandfather   ?     4X  ? ?Social History  ? ?Socioeconomic History  ? Marital status: Divorced  ?  Spouse name: Not on file  ? Number of children: 1  ? Years of education: Not on file  ? Highest education level: Not on file  ?Occupational History  ?  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 Smoking status: Never  ? Smokeless tobacco: Never  ?Substance and Sexual Activity  ? Alcohol use: Yes  ?  Alcohol/week: 1.0 standard drink  ?  Types: 1 Standard drinks or equivalent per week  ?  Comment: Occasional  ? Drug use: No  ? Sexual activity: Not on file  ?Other Topics Concern  ? Not on file  ?Social History Narrative  ? Not on file  ? ?Social Determinants of Health  ? ?Financial Resource Strain: Not on file  ?Food Insecurity: Not on file  ?Transportation Needs: Not on file  ?Physical Activity: Not on file  ?Stress: Not on file  ?Social Connections: Not on file  ? ?Outpatient Medications Prior to Visit  ?Medication Sig  ? norethindrone-ethinyl estradiol (JUNEL FE,GILDESS FE,LOESTRIN FE) 1-20 MG-MCG tablet Take 1 tablet  by mouth daily.  ? omega-3 acid ethyl esters (LOVAZA) 1 g capsule Take 1 capsule (1 g total) by mouth daily.  ? Evolocumab (REPATHA SURECLICK) 140 MG/ML SOAJ Inject 140 mg into the skin every 14 (fourteen) days. (Patient not taking: Reported on 05/28/2021)  ? ?No facility-administered medications prior to visit.  ? ?Allergies  ?Allergen Reactions  ? Atorvastatin   ?  myalgias  ? Demerol [Meperidine] Nausea Only  ? Rosuvastatin   ?  myalgias  ? Statins Other (See Comments)  ?  Myalgia  ? ?Immunization History  ?Administered Date(s) Administered  ? Influenza Inj Mdck Quad Pf 12/29/2016  ? Influenza,inj,quad, With Preservative 11/30/2016  ? ? ?Review of Systems ?All review of systems negative except what is listed in the HPI ? ? Objective  ?  ?BP  117/72   Pulse 98   Temp 98.4 ?F (36.9 ?C)   Ht 5\' 4"  (1.626 m)   Wt 142 lb 6.4 oz (64.6 kg)   SpO2 98%   BMI 24.44 kg/m?  ?Physical Exam ?Vitals and nursing note reviewed.  ?Constitutional:   ?   General: She is not in acute distress. ?   Appearance: Normal appearance.  ?Eyes:  ?   Extraocular Movements: Extraocular movements intact.  ?   Conjunctiva/sclera: Conjunctivae normal.  ?   Pupils: Pupils are equal, round, and reactive to light.  ?Neck:  ?   Vascular: No carotid bruit.  ?Cardiovascular:  ?   Rate and Rhythm: Normal rate and regular rhythm.  ?   Pulses: Normal pulses.  ?   Heart sounds: Normal heart sounds. No murmur heard. ?Pulmonary:  ?   Effort: Pulmonary effort is normal.  ?   Breath sounds: Normal breath sounds. No wheezing.  ?Abdominal:  ?   General: Bowel sounds are normal.  ?   Palpations: Abdomen is soft.  ?Musculoskeletal:     ?   General: Normal range of motion.  ?   Cervical back: Normal range of motion.  ?   Right lower leg: No edema.  ?   Left lower leg: No edema.  ?Skin: ?   General: Skin is warm and dry.  ?   Capillary Refill: Capillary refill takes less than 2 seconds.  ?Neurological:  ?   General: No focal deficit present.  ?   Mental Status: She is alert and oriented to person, place, and time.  ?Psychiatric:     ?   Mood and Affect: Mood normal.     ?   Behavior: Behavior normal.     ?   Thought Content: Thought content normal.     ?   Judgment: Judgment normal.  ? ? ?No results found for any visits on 05/28/21. ? Assessment & Plan   ?  ? ?Problem List Items Addressed This Visit   ? ? Attention deficit hyperactivity disorder (ADHD), predominantly inattentive type - Primary  ?  Chronic. Attention and focus deficit with no current therapy.  ?Will trial Ritalin BID to see if this is helpful for her symptoms to allow her to function at best capacity.  ?Recommend avoid taking on days that attention and focus are not required to help prevent dependence.  ?Will follow-up in the next few  weeks once labs have resulted to see how she is doing.  ?  ?  ? Relevant Medications  ? methylphenidate (RITALIN) 20 MG tablet  ? methylphenidate (RITALIN) 20 MG tablet (Start on 06/25/2021)  ? methylphenidate (RITALIN) 10 MG tablet (  Start on 07/23/2021)  ? Weight gain  ?  Weight loss difficulty with strict dietary and exercise habits.  ?BMI 24.44 today with no signs of obesity or central obesity present.  ?Suspect possible hormonal involvement given history of cortisol level alterations.  ?Limited caloric intake possibly contributing to impaired metabolism.  ?Will obtain labs today for evaluation and make recommendations based on findings.  ? ?  ?  ? Relevant Orders  ? CBC with Differential/Platelet  ? Comprehensive metabolic panel  ? Lipid panel  ? TSH  ? T4  ? T3  ? VITAMIN D 25 Hydroxy (Vit-D Deficiency, Fractures)  ? Hemoglobin A1c  ? Insulin, Free and Total  ? Cortisol  ? ?Other Visit Diagnoses   ? ? Healthcare maintenance      ? Relevant Orders  ? CBC with Differential/Platelet  ? Comprehensive metabolic panel  ? Lipid panel  ? TSH  ? T4  ? T3  ? VITAMIN D 25 Hydroxy (Vit-D Deficiency, Fractures)  ? Hemoglobin A1c  ? Insulin, Free and Total  ? Cortisol  ? Elevated cortisol level      ? Relevant Orders  ? CBC with Differential/Platelet  ? Comprehensive metabolic panel  ? Lipid panel  ? TSH  ? T4  ? T3  ? VITAMIN D 25 Hydroxy (Vit-D Deficiency, Fractures)  ? Hemoglobin A1c  ? Insulin, Free and Total  ? Cortisol  ? ?  ? ? ? ?Return for TBD based on labs.  ?  ? ? ?Geoffry Bannister, Sung Amabile, NP, DNP, AGNP-C ?Primary Care & Sports Medicine at [facility] ?[facility]  ? ?

## 2021-06-03 ENCOUNTER — Encounter (HOSPITAL_BASED_OUTPATIENT_CLINIC_OR_DEPARTMENT_OTHER): Payer: Self-pay | Admitting: Nurse Practitioner

## 2021-06-03 DIAGNOSIS — R635 Abnormal weight gain: Secondary | ICD-10-CM | POA: Insufficient documentation

## 2021-06-03 DIAGNOSIS — F900 Attention-deficit hyperactivity disorder, predominantly inattentive type: Secondary | ICD-10-CM | POA: Insufficient documentation

## 2021-06-03 NOTE — Assessment & Plan Note (Signed)
Weight loss difficulty with strict dietary and exercise habits.  ?BMI 24.44 today with no signs of obesity or central obesity present.  ?Suspect possible hormonal involvement given history of cortisol level alterations.  ?Limited caloric intake possibly contributing to impaired metabolism.  ?Will obtain labs today for evaluation and make recommendations based on findings.  ? ?

## 2021-06-03 NOTE — Assessment & Plan Note (Signed)
Chronic. Attention and focus deficit with no current therapy.  ?Will trial Ritalin BID to see if this is helpful for her symptoms to allow her to function at best capacity.  ?Recommend avoid taking on days that attention and focus are not required to help prevent dependence.  ?Will follow-up in the next few weeks once labs have resulted to see how she is doing.  ?

## 2021-07-07 DIAGNOSIS — Z6824 Body mass index (BMI) 24.0-24.9, adult: Secondary | ICD-10-CM | POA: Diagnosis not present

## 2021-07-07 DIAGNOSIS — E7800 Pure hypercholesterolemia, unspecified: Secondary | ICD-10-CM | POA: Diagnosis not present

## 2021-07-07 DIAGNOSIS — N951 Menopausal and female climacteric states: Secondary | ICD-10-CM | POA: Diagnosis not present

## 2021-07-07 DIAGNOSIS — R635 Abnormal weight gain: Secondary | ICD-10-CM | POA: Diagnosis not present

## 2021-07-07 DIAGNOSIS — R5383 Other fatigue: Secondary | ICD-10-CM | POA: Diagnosis not present

## 2021-07-09 DIAGNOSIS — R7989 Other specified abnormal findings of blood chemistry: Secondary | ICD-10-CM | POA: Diagnosis not present

## 2021-07-09 DIAGNOSIS — Z1339 Encounter for screening examination for other mental health and behavioral disorders: Secondary | ICD-10-CM | POA: Diagnosis not present

## 2021-07-09 DIAGNOSIS — Z1331 Encounter for screening for depression: Secondary | ICD-10-CM | POA: Diagnosis not present

## 2021-07-09 DIAGNOSIS — M2550 Pain in unspecified joint: Secondary | ICD-10-CM | POA: Diagnosis not present

## 2021-07-09 DIAGNOSIS — R635 Abnormal weight gain: Secondary | ICD-10-CM | POA: Diagnosis not present

## 2021-07-09 DIAGNOSIS — E7800 Pure hypercholesterolemia, unspecified: Secondary | ICD-10-CM | POA: Diagnosis not present

## 2021-07-10 DIAGNOSIS — E7800 Pure hypercholesterolemia, unspecified: Secondary | ICD-10-CM | POA: Diagnosis not present

## 2021-07-10 DIAGNOSIS — Z6824 Body mass index (BMI) 24.0-24.9, adult: Secondary | ICD-10-CM | POA: Diagnosis not present

## 2021-07-11 DIAGNOSIS — J329 Chronic sinusitis, unspecified: Secondary | ICD-10-CM | POA: Diagnosis not present

## 2021-07-11 DIAGNOSIS — B9689 Other specified bacterial agents as the cause of diseases classified elsewhere: Secondary | ICD-10-CM | POA: Diagnosis not present

## 2021-07-11 DIAGNOSIS — J309 Allergic rhinitis, unspecified: Secondary | ICD-10-CM | POA: Diagnosis not present

## 2021-07-11 DIAGNOSIS — J069 Acute upper respiratory infection, unspecified: Secondary | ICD-10-CM | POA: Diagnosis not present

## 2021-07-23 DIAGNOSIS — Z6824 Body mass index (BMI) 24.0-24.9, adult: Secondary | ICD-10-CM | POA: Diagnosis not present

## 2021-07-23 DIAGNOSIS — M2550 Pain in unspecified joint: Secondary | ICD-10-CM | POA: Diagnosis not present

## 2021-07-30 DIAGNOSIS — E7800 Pure hypercholesterolemia, unspecified: Secondary | ICD-10-CM | POA: Diagnosis not present

## 2021-07-30 DIAGNOSIS — Z6824 Body mass index (BMI) 24.0-24.9, adult: Secondary | ICD-10-CM | POA: Diagnosis not present

## 2021-08-06 DIAGNOSIS — Z6824 Body mass index (BMI) 24.0-24.9, adult: Secondary | ICD-10-CM | POA: Diagnosis not present

## 2021-08-06 DIAGNOSIS — M2550 Pain in unspecified joint: Secondary | ICD-10-CM | POA: Diagnosis not present

## 2021-08-13 DIAGNOSIS — R5383 Other fatigue: Secondary | ICD-10-CM | POA: Diagnosis not present

## 2021-08-13 DIAGNOSIS — Z6824 Body mass index (BMI) 24.0-24.9, adult: Secondary | ICD-10-CM | POA: Diagnosis not present

## 2021-08-22 ENCOUNTER — Other Ambulatory Visit (HOSPITAL_BASED_OUTPATIENT_CLINIC_OR_DEPARTMENT_OTHER): Payer: Self-pay | Admitting: Nurse Practitioner

## 2021-08-22 ENCOUNTER — Other Ambulatory Visit (HOSPITAL_BASED_OUTPATIENT_CLINIC_OR_DEPARTMENT_OTHER): Payer: Self-pay

## 2021-08-22 DIAGNOSIS — Z8249 Family history of ischemic heart disease and other diseases of the circulatory system: Secondary | ICD-10-CM | POA: Diagnosis not present

## 2021-08-22 DIAGNOSIS — R072 Precordial pain: Secondary | ICD-10-CM | POA: Diagnosis not present

## 2021-08-22 DIAGNOSIS — R918 Other nonspecific abnormal finding of lung field: Secondary | ICD-10-CM

## 2021-08-22 DIAGNOSIS — E785 Hyperlipidemia, unspecified: Secondary | ICD-10-CM

## 2021-08-22 DIAGNOSIS — R635 Abnormal weight gain: Secondary | ICD-10-CM | POA: Diagnosis not present

## 2021-08-28 LAB — COMPREHENSIVE METABOLIC PANEL
ALT: 13 IU/L (ref 0–32)
AST: 17 IU/L (ref 0–40)
Albumin/Globulin Ratio: 1.6 (ref 1.2–2.2)
Albumin: 4.4 g/dL (ref 3.8–4.8)
Alkaline Phosphatase: 44 IU/L (ref 44–121)
BUN/Creatinine Ratio: 10 (ref 9–23)
BUN: 8 mg/dL (ref 6–24)
Bilirubin Total: 0.4 mg/dL (ref 0.0–1.2)
CO2: 22 mmol/L (ref 20–29)
Calcium: 9.5 mg/dL (ref 8.7–10.2)
Chloride: 105 mmol/L (ref 96–106)
Creatinine, Ser: 0.78 mg/dL (ref 0.57–1.00)
Globulin, Total: 2.7 g/dL (ref 1.5–4.5)
Glucose: 77 mg/dL (ref 70–99)
Potassium: 4.6 mmol/L (ref 3.5–5.2)
Sodium: 142 mmol/L (ref 134–144)
Total Protein: 7.1 g/dL (ref 6.0–8.5)
eGFR: 95 mL/min/{1.73_m2} (ref >59)

## 2021-08-28 LAB — TSH: TSH: 2.65 u[IU]/mL (ref 0.450–4.500)

## 2021-08-28 LAB — CBC WITH DIFFERENTIAL/PLATELET
Basophils Absolute: 0 10*3/uL (ref 0.0–0.2)
Basos: 0 %
EOS (ABSOLUTE): 0.1 10*3/uL (ref 0.0–0.4)
Eos: 1 %
Hematocrit: 44.3 % (ref 34.0–46.6)
Hemoglobin: 14.6 g/dL (ref 11.1–15.9)
Immature Grans (Abs): 0.1 10*3/uL (ref 0.0–0.1)
Immature Granulocytes: 1 %
Lymphocytes Absolute: 2.9 10*3/uL (ref 0.7–3.1)
Lymphs: 38 %
MCH: 30.9 pg (ref 26.6–33.0)
MCHC: 33 g/dL (ref 31.5–35.7)
MCV: 94 fL (ref 79–97)
Monocytes Absolute: 0.5 10*3/uL (ref 0.1–0.9)
Monocytes: 7 %
Neutrophils Absolute: 4.2 10*3/uL (ref 1.4–7.0)
Neutrophils: 53 %
Platelets: 272 10*3/uL (ref 150–450)
RBC: 4.73 x10E6/uL (ref 3.77–5.28)
RDW: 11.8 % (ref 11.7–15.4)
WBC: 7.7 10*3/uL (ref 3.4–10.8)

## 2021-08-28 LAB — LIPID PANEL
Chol/HDL Ratio: 4.2 ratio (ref 0.0–4.4)
Cholesterol, Total: 233 mg/dL — ABNORMAL HIGH (ref 100–199)
HDL: 56 mg/dL (ref >39)
LDL Chol Calc (NIH): 157 mg/dL — ABNORMAL HIGH (ref 0–99)
Triglycerides: 112 mg/dL (ref 0–149)
VLDL Cholesterol Cal: 20 mg/dL (ref 5–40)

## 2021-08-28 LAB — INSULIN, FREE AND TOTAL
Free Insulin: 4.4 uU/mL
Total Insulin: 4.4 uU/mL

## 2021-08-28 LAB — VITAMIN D 25 HYDROXY (VIT D DEFICIENCY, FRACTURES): Vit D, 25-Hydroxy: 138.8 ng/mL — ABNORMAL HIGH (ref 30.0–100.0)

## 2021-08-28 LAB — HEMOGLOBIN A1C
Est. average glucose Bld gHb Est-mCnc: 91 mg/dL
Hgb A1c MFr Bld: 4.8 % (ref 4.8–5.6)

## 2021-08-28 LAB — CORTISOL: Cortisol: 14.8 ug/dL (ref 6.2–19.4)

## 2021-09-16 ENCOUNTER — Encounter (HOSPITAL_BASED_OUTPATIENT_CLINIC_OR_DEPARTMENT_OTHER): Payer: Self-pay | Admitting: Nurse Practitioner

## 2021-09-16 ENCOUNTER — Ambulatory Visit (HOSPITAL_BASED_OUTPATIENT_CLINIC_OR_DEPARTMENT_OTHER): Payer: BC Managed Care – PPO | Admitting: Nurse Practitioner

## 2021-09-16 VITALS — BP 120/87 | HR 72 | Ht 64.0 in | Wt 140.7 lb

## 2021-09-16 DIAGNOSIS — M5442 Lumbago with sciatica, left side: Secondary | ICD-10-CM | POA: Diagnosis not present

## 2021-09-16 DIAGNOSIS — E8881 Metabolic syndrome: Secondary | ICD-10-CM

## 2021-09-16 DIAGNOSIS — M722 Plantar fascial fibromatosis: Secondary | ICD-10-CM

## 2021-09-16 DIAGNOSIS — E669 Obesity, unspecified: Secondary | ICD-10-CM | POA: Diagnosis not present

## 2021-09-16 DIAGNOSIS — G8929 Other chronic pain: Secondary | ICD-10-CM

## 2021-09-16 MED ORDER — WEGOVY 0.5 MG/0.5ML ~~LOC~~ SOAJ: 0.5000 mg | SUBCUTANEOUS | 2 refills | Status: DC

## 2021-09-16 MED ORDER — WEGOVY 0.25 MG/0.5ML ~~LOC~~ SOAJ: 0.2500 mg | SUBCUTANEOUS | 0 refills | Status: DC

## 2021-09-16 NOTE — Patient Instructions (Signed)
It was a pleasure seeing you today. I hope your time spent with [place] was pleasant and helpful. Please let us know if there is anything we can do to improve the service you receive.   I recommend working towards increasing your healthy fats in your diet.  Nuts, seeds, berries, beans Avocado oil and olive oil   The following orders have been placed for you today:  Orders Placed This Encounter  Procedures   Ambulatory referral to Physical Therapy    Referral Priority:   Routine    Referral Type:   Physical Medicine    Referral Reason:   Specialty Services Required    Requested Specialty:   Physical Therapy    Number of Visits Requested:   1     Important Office Information Lab Results If labs were ordered, please note that you will see results through MyChart as soon as they come available from [facility].  It takes up to 5 business days for the results to be routed to me and for me to review them once all of the lab results have come through from [facility]. I will make recommendations based on your results and send these through MyChart or someone from the office will call you to discuss. If your labs are abnormal, we may contact you to schedule a visit to discuss the results and make recommendations.  If you have not heard from [place] within 5 business days or you have waited longer than a week and your lab results have not come through on MyChart, please feel free to call the office or send a message through MyChart to follow-up on these labs.   Referrals If referrals were placed today, the office where the referral was sent will contact you either by phone or through MyChart to set up scheduling. Please note that it can take up to a week for the referral office to contact you. If you do not hear from them in a week, please contact the referral office directly to inquire about scheduling.   Condition Treated If your condition worsens or you begin to have new symptoms, please schedule a follow-up  appointment for further evaluation. If you are not sure if an appointment is needed, you may call the office to leave a message for the nurse and someone will contact you with recommendations.  If you have an urgent or life threatening emergency, please do not call the office, but seek emergency evaluation by calling 911 or going to the nearest emergency room for evaluation.   MyChart and Phone Calls Please do not use MyChart for urgent messages. It may take up to 3 business days for MyChart messages to be read by staff and if they are unable to handle the request, an additional 3 business days for them to be routed to me and for my response.  Messages sent to the provider through MyChart do not come directly to the provider, please allow time for these messages to be routed and for me to respond.  We get a large volume of MyChart messages daily and these are responded to in the order received.   For urgent messages, please call the office at [phone] and speak with the front office staff or leave a message on the line of my assistant for guidance.  We are seeing patients from the hours of 8:00 am through 5:00 pm and calls directly to the nurse may not be answered immediately due to seeing patients, but your call will be  returned as soon as possible.  Phone  messages received after 4:00 PM Monday through Thursday may not be returned until the following business day. Phone messages received after 11:00 AM on Friday may not be returned until Monday.   After Hours We share on call hours with providers from other offices. If you have an urgent need after hours that cannot wait until the next business day, please contact the on call provider by calling the office number. A nurse will speak with you and contact the provider if needed for recommendations.  If you have an urgent or life threatening emergency after hours, please do not call the on call provider, but seek emergency evaluation by calling 911  or going to the nearest emergency room for evaluation.   Paperwork All paperwork requires a minimum of 5 days to complete and return to you or the designated personnel. Please keep this in mind when bringing in forms or sending requests for paperwork completion to the office.

## 2021-09-16 NOTE — Progress Notes (Signed)
Evelyn Clamp, DNP, AGNP-c [facility] [address] [place], [place] [address] [phone] Office [phone] Fax  ESTABLISHED PATIENT- Chronic Health and/or Follow-Up Visit  Blood pressure 120/87, pulse 72, height 5\' 4"  (1.626 m), weight 140 lb 11.2 oz (63.8 kg), SpO2 100 %.  Follow-up (Pt here for f/u on wegovy and she need refills on medication )  Check on massage covered by insurance  HPI  Evelyn Sellers  is a 46 y.o. year old female presenting today for evaluation and management of the following: Weight Management Silena has been seeing [facility] for help with weight management  She has had hormone testing completed there She tells me she was told her testosterone was low, metabolism was high, cortisol was high She has been given growth hormone (tesamorlin) injections to facilitate weight loss She is concerned about her 36 inch waist line, family history of heart disease, and personal history of HLD and the effects that these will have on her overall health. Has not been on statins due to muscle pain, went to cardiology and they wanted to put her on an injectable, but she did not want to do that  She is interested in utilizing [facility] for weight management Stress  She tells me she feels like she has her stress under control She denies any new or worsening symptoms Chronic back pain She endorses back pain that is chronic in nature  She tells me that even lifting a small amount of weight in the gym it hurts She has has changed her mattress with no improvement She also reports chiropractor care with no improvement.   She is also experiencing symptoms of plantar fasciitis in feet bilaterally with tenderness on the plantar surface of the foot and heel.  She tells me that she frequently experiences left sided scaitic pain while running- usually about the 2nd mile in.  She relates this to her back pain.  ROS All ROS negative  with exception of what is listed in HPI  PHYSICAL EXAM Physical Exam Vitals and nursing note reviewed.  Constitutional:      General: She is not in acute distress.    Appearance: Normal appearance.  HENT:     Head: Normocephalic.  Eyes:     Extraocular Movements: Extraocular movements intact.     Conjunctiva/sclera: Conjunctivae normal.     Pupils: Pupils are equal, round, and reactive to light.  Neck:     Vascular: No carotid bruit.  Cardiovascular:     Rate and Rhythm: Normal rate and regular rhythm.     Pulses: Normal pulses.     Heart sounds: Normal heart sounds. No murmur heard. Pulmonary:     Effort: Pulmonary effort is normal.     Breath sounds: Normal breath sounds. No wheezing.  Abdominal:     General: Bowel sounds are normal. There is no distension.     Palpations: Abdomen is soft.     Tenderness: There is no abdominal tenderness. There is no guarding.  Musculoskeletal:     Cervical back: Normal range of motion and neck supple.     Lumbar back: Tenderness present. No spasms. Normal range of motion. Positive left straight leg raise test. Negative right straight leg raise test.     Right lower leg: No edema.     Left lower leg: No edema.  Lymphadenopathy:     Cervical: No cervical adenopathy.  Skin:    General: Skin is warm and dry.  Capillary Refill: Capillary refill takes less than 2 seconds.  Neurological:     General: No focal deficit present.     Mental Status: She is alert and oriented to person, place, and time.  Psychiatric:        Mood and Affect: Mood normal.        Behavior: Behavior normal.        Thought Content: Thought content normal.        Judgment: Judgment normal.     ASSESSMENT & PLAN Problem List Items Addressed This Visit     Bilateral plantar fasciitis    Symptoms and presentation consistent with bilateral planter fasciitis.  Discussion with patient on stretching exercises that may be helpful as well as nighttime foot versus which  may help provide some relief.  Recommend icing the bottom of the feet and back of the heel several times a day and wearing supportive shoes.  We will send referral for physical therapy for further evaluation and recommendations.      Relevant Orders   Ambulatory referral to Physical Therapy   Chronic bilateral low back pain with left-sided sciatica    Chronic in nature with no change with chiropractor care or changing sleeping surfaces.  Discussed treatment options with the patient today.  She is interested in physical therapy to see if this would be helpful for symptom relief.  Recommend TENS unit as well to help with muscle spasms and pain.  We will send referral today.  Follow-up if symptoms worsen or fail to improve      Relevant Medications   Semaglutide-Weight Management (WEGOVY) 0.5 MG/0.5ML SOAJ   Semaglutide-Weight Management (WEGOVY) 0.25 MG/0.5ML SOAJ   Other Relevant Orders   Ambulatory referral to Physical Therapy   Abdominal obesity and metabolic syndrome - Primary    Concern with abdominal weight and the effects that this may have on her health.  Overall the patient does appear very healthy and she is physically active which adds a significant benefit to her overall health.  She is very interested in [place] for weight management.  Given her chronic concerns and abdominal obesity I do feel that it is a reasonable consideration to help lower her risks of cardiovascular disease especially in the setting of elevated lipids and a family history of coronary artery disease.  We will plan to begin medication and monitor closely.  Diet and exercise recommendations provided.  Patient encouraged to follow-up immediately if she begins to notice any significant side effects or has any questions about the medication.      Relevant Medications   Semaglutide-Weight Management (WEGOVY) 0.5 MG/0.5ML SOAJ   Semaglutide-Weight Management (WEGOVY) 0.25 MG/0.5ML SOAJ     FOLLOW-UP Return in about 3  months (around 12/17/2021) for Weight management.  Evelyn Clamp, DNP, AGNP-c

## 2021-09-23 ENCOUNTER — Telehealth (HOSPITAL_BASED_OUTPATIENT_CLINIC_OR_DEPARTMENT_OTHER): Payer: Self-pay

## 2021-09-23 NOTE — Telephone Encounter (Signed)
Key [id number] processed

## 2021-09-24 NOTE — Telephone Encounter (Signed)
Received a PA Request Form from [facility] on 7/26.papers are left in providers yellow tray fro [facility].

## 2021-09-25 NOTE — Telephone Encounter (Signed)
[employer] canceled PA due to patient's member ID.  Confirmed patient's pharmacy benefits and resubmitted PA. Key: [id number]. Waiting ins determination

## 2021-09-29 DIAGNOSIS — L814 Other melanin hyperpigmentation: Secondary | ICD-10-CM | POA: Diagnosis not present

## 2021-09-29 DIAGNOSIS — Z85828 Personal history of other malignant neoplasm of skin: Secondary | ICD-10-CM | POA: Diagnosis not present

## 2021-09-29 DIAGNOSIS — I781 Nevus, non-neoplastic: Secondary | ICD-10-CM | POA: Diagnosis not present

## 2021-09-29 DIAGNOSIS — L821 Other seborrheic keratosis: Secondary | ICD-10-CM | POA: Diagnosis not present

## 2021-09-29 DIAGNOSIS — L820 Inflamed seborrheic keratosis: Secondary | ICD-10-CM | POA: Diagnosis not present

## 2021-09-29 NOTE — Telephone Encounter (Signed)
"  Please note this patient does not have RX benefits with [employer] commercial line of business."  PA canceled again by patient's ins plan.

## 2021-10-12 ENCOUNTER — Telehealth: Payer: BC Managed Care – PPO | Admitting: Physician Assistant

## 2021-10-12 DIAGNOSIS — U071 COVID-19: Secondary | ICD-10-CM

## 2021-10-12 MED ORDER — NIRMATRELVIR/RITONAVIR (PAXLOVID)TABLET: 3.0000 | ORAL_TABLET | Freq: Two times a day (BID) | ORAL | 0 refills | Status: AC

## 2021-10-12 NOTE — Patient Instructions (Signed)
Start paxlovid If you feel worse, or have new symptoms, contact your PCP or go to Urgent Care or the ER. Push fluids and rest!

## 2021-10-12 NOTE — Progress Notes (Signed)
Virtual Visit Consent   Evelyn Sellers, you are scheduled for a virtual visit with a [facility] provider today. Just as with appointments in the office, your consent must be obtained to participate. Your consent will be active for this visit and any virtual visit you may have with one of our providers in the next 365 days. If you have a MyChart account, a copy of this consent can be sent to you electronically.  As this is a virtual visit, video technology does not allow for your provider to perform a traditional examination. This may limit your provider's ability to fully assess your condition. If your provider identifies any concerns that need to be evaluated in person or the need to arrange testing (such as labs, EKG, etc.), we will make arrangements to do so. Although advances in technology are sophisticated, we cannot ensure that it will always work on either your end or our end. If the connection with a video visit is poor, the visit may have to be switched to a telephone visit. With either a video or telephone visit, we are not always able to ensure that we have a secure connection.  By engaging in this virtual visit, you consent to the provision of healthcare and authorize for your insurance to be billed (if applicable) for the services provided during this visit. Depending on your insurance coverage, you may receive a charge related to this service.  I need to obtain your verbal consent now. Are you willing to proceed with your visit today? Evelyn Sellers has provided verbal consent on 10/12/2021 for a virtual visit (video or telephone). Jarold Motto, [place]  Date: 10/12/2021 9:29 AM  Virtual Visit via Video Note   I, Jarold Motto, connected with  Evelyn Sellers  ([id number], 09/06/1975) on 10/12/21 at  9:15 AM EDT by a video-enabled telemedicine application and verified that I am speaking with the correct person using two identifiers.  Location: Patient: Virtual Visit  Location Patient: Home Provider: Virtual Visit Location Provider: Home Office   I discussed the limitations of evaluation and management by telemedicine and the availability of in person appointments. The patient expressed understanding and agreed to proceed.    History of Present Illness: Evelyn Sellers is a 46 y.o. who identifies as a female who was assigned female at birth, and is being seen today for COVID-19.  Just returned from [facility] Sx started Friday -- sore throat, fever, body aches, malaise Meds -- nyquil and mucinex, zyrtec COVID test positive this morning. Has had covid once before, this was before paxlovid was available -- she reports that she had to take mucinex for about a month afterwards.  Denies: chest pain, SOB, difficulty breathing, pain in legs, decreased oral intake  HPI: HPI  Problems:  Patient Active Problem List   Diagnosis Date Noted   Attention deficit hyperactivity disorder (ADHD), predominantly inattentive type 06/03/2021   Weight gain 06/03/2021   Bilateral plantar fasciitis 11/16/2018   Pulmonary nodules/lesions, multiple 10/27/2018   Family history of coronary artery disease in grandmother 09/06/2018   Osteoarthritis of right hip 06/09/2017   Chest pain 10/18/2014   Dyslipidemia, goal LDL below 100 10/18/2014    Allergies:  Allergies  Allergen Reactions   Atorvastatin     myalgias   Demerol [Meperidine] Nausea Only   Rosuvastatin     myalgias   Statins Other (See Comments)    Myalgia   Medications:  Current Outpatient Medications:    nirmatrelvir/ritonavir EUA (PAXLOVID) 20  x 150 MG & 10 x 100MG  TABS, Take 3 tablets by mouth 2 (two) times daily for 5 days. (Take nirmatrelvir 150 mg two tablets twice daily for 5 days and ritonavir 100 mg one tablet twice daily for 5 days), Disp: 30 tablet, Rfl: 0   Evolocumab (REPATHA SURECLICK) 140 MG/ML SOAJ, Inject 140 mg into the skin every 14 (fourteen) days. (Patient not taking: Reported on  05/28/2021), Disp: 2 mL, Rfl: 11   methylphenidate (RITALIN) 10 MG tablet, Take 2 tablets (20 mg total) by mouth 2 (two) times daily with breakfast and lunch., Disp: 30 tablet, Rfl: 0   methylphenidate (RITALIN) 20 MG tablet, Take 1 tablet (20 mg total) by mouth 2 (two) times daily with breakfast and lunch. (Patient not taking: Reported on 09/16/2021), Disp: 60 tablet, Rfl: 0   methylphenidate (RITALIN) 20 MG tablet, Take 1 tablet (20 mg total) by mouth 2 (two) times daily with breakfast and lunch. (Patient not taking: Reported on 09/16/2021), Disp: 60 tablet, Rfl: 0   norethindrone-ethinyl estradiol (JUNEL FE,GILDESS FE,LOESTRIN FE) 1-20 MG-MCG tablet, Take 1 tablet by mouth daily., Disp: , Rfl:    omega-3 acid ethyl esters (LOVAZA) 1 g capsule, Take 1 capsule (1 g total) by mouth daily. (Patient not taking: Reported on 09/16/2021), Disp: 30 capsule, Rfl: 6   Semaglutide-Weight Management (WEGOVY) 0.25 MG/0.5ML SOAJ, Inject 0.25 mg into the skin once a week., Disp: 2 mL, Rfl: 0   Semaglutide-Weight Management (WEGOVY) 0.5 MG/0.5ML SOAJ, Inject 0.5 mg into the skin once a week., Disp: 2 mL, Rfl: 2  Observations/Objective: Patient is well-developed, well-nourished in no acute distress.  Resting comfortably  at home.  Head is normocephalic, atraumatic.  No labored breathing.  Speech is clear and coherent with logical content.  Patient is alert and oriented at baseline.   Assessment and Plan: 1. COVID-19 No red flags on  visual exam.  Will initiate Paxlovid per orders. Discussed taking medications as prescribed.  Did discuss that this can reduce efficacy of her OCP -- she is aware, husband has had vasectomy. Reviewed return precautions including worsening fever, SOB, worsening cough or other concerns. Push fluids and rest. I recommend that patient follow-up if symptoms worsen or persist despite treatment x 7-10 days, sooner if needed.  Follow Up Instructions: I discussed the assessment and treatment  plan with the patient. The patient was provided an opportunity to ask questions and all were answered. The patient agreed with the plan and demonstrated an understanding of the instructions.  A copy of instructions were sent to the patient via MyChart unless otherwise noted below.    The patient was advised to call back or seek an in-person evaluation if the symptoms worsen or if the condition fails to improve as anticipated.  Time:  I spent 5-10 minutes with the patient via telehealth technology discussing the above problems/concerns.    09/18/2021, Jarold Motto

## 2021-10-29 DIAGNOSIS — E8881 Metabolic syndrome: Secondary | ICD-10-CM | POA: Insufficient documentation

## 2021-10-29 DIAGNOSIS — N925 Other specified irregular menstruation: Secondary | ICD-10-CM | POA: Diagnosis not present

## 2021-10-29 DIAGNOSIS — G8929 Other chronic pain: Secondary | ICD-10-CM | POA: Insufficient documentation

## 2021-10-29 NOTE — Assessment & Plan Note (Signed)
Chronic in nature with no change with chiropractor care or changing sleeping surfaces.  Discussed treatment options with the patient today.  She is interested in physical therapy to see if this would be helpful for symptom relief.  Recommend TENS unit as well to help with muscle spasms and pain.  We will send referral today.  Follow-up if symptoms worsen or fail to improve

## 2021-10-29 NOTE — Assessment & Plan Note (Signed)
Concern with abdominal weight and the effects that this may have on her health.  Overall the patient does appear very healthy and she is physically active which adds a significant benefit to her overall health.  She is very interested in [place] for weight management.  Given her chronic concerns and abdominal obesity I do feel that it is a reasonable consideration to help lower her risks of cardiovascular disease especially in the setting of elevated lipids and a family history of coronary artery disease.  We will plan to begin medication and monitor closely.  Diet and exercise recommendations provided.  Patient encouraged to follow-up immediately if she begins to notice any significant side effects or has any questions about the medication.

## 2021-10-29 NOTE — Assessment & Plan Note (Signed)
Symptoms and presentation consistent with bilateral planter fasciitis.  Discussion with patient on stretching exercises that may be helpful as well as nighttime foot versus which may help provide some relief.  Recommend icing the bottom of the feet and back of the heel several times a day and wearing supportive shoes.  We will send referral for physical therapy for further evaluation and recommendations.

## 2021-11-07 DIAGNOSIS — J329 Chronic sinusitis, unspecified: Secondary | ICD-10-CM | POA: Diagnosis not present

## 2021-11-07 DIAGNOSIS — J310 Chronic rhinitis: Secondary | ICD-10-CM | POA: Insufficient documentation

## 2021-11-17 ENCOUNTER — Encounter (HOSPITAL_BASED_OUTPATIENT_CLINIC_OR_DEPARTMENT_OTHER): Payer: Self-pay | Admitting: Physical Therapy

## 2021-11-17 ENCOUNTER — Other Ambulatory Visit: Payer: Self-pay

## 2021-11-17 ENCOUNTER — Ambulatory Visit (HOSPITAL_BASED_OUTPATIENT_CLINIC_OR_DEPARTMENT_OTHER): Payer: BC Managed Care – PPO | Attending: Nurse Practitioner | Admitting: Physical Therapy

## 2021-11-17 DIAGNOSIS — M5442 Lumbago with sciatica, left side: Secondary | ICD-10-CM | POA: Diagnosis not present

## 2021-11-17 DIAGNOSIS — M722 Plantar fascial fibromatosis: Secondary | ICD-10-CM | POA: Diagnosis not present

## 2021-11-17 DIAGNOSIS — G8929 Other chronic pain: Secondary | ICD-10-CM | POA: Insufficient documentation

## 2021-11-17 NOTE — Therapy (Addendum)
OUTPATIENT PHYSICAL THERAPY THORACOLUMBAR EVALUATION   Patient Name: Evelyn Sellers MRN: [id number] DOB:May 18, 1975, 46 y.o., female Today's Date: 11/17/2021   PT End of Session - 11/17/21 1435     Visit Number 1    Number of Visits 17    Date for PT Re-Evaluation 01/16/22    Authorization Type BCBS    PT Start Time 1434    PT Stop Time 1512    PT Time Calculation (min) 38 min    Activity Tolerance Patient tolerated treatment well    Behavior During Therapy WFL for tasks assessed/performed             Past Medical History:  Diagnosis Date   Abdominal pregnancy with intrauterine pregnancy    Abdominal pregnancy with intrauterine pregnancy 03/17/2010   Qualifier: Diagnosis of  By: Elizebeth Koller MD, Mora Appl    Gestational diabetes    Hyperlipemia    Past Surgical History:  Procedure Laterality Date   ANTERIOR CRUCIATE LIGAMENT REPAIR     Patient Active Problem List   Diagnosis Date Noted   Chronic bilateral low back pain with left-sided sciatica 10/29/2021   Abdominal obesity and metabolic syndrome 03/04/7251   Attention deficit hyperactivity disorder (ADHD), predominantly inattentive type 06/03/2021   Weight gain 06/03/2021   Bilateral plantar fasciitis 11/16/2018   Pulmonary nodules/lesions, multiple 10/27/2018   Family history of coronary artery disease in grandmother 09/06/2018   Osteoarthritis of right hip 06/09/2017   Chest pain 10/18/2014   Dyslipidemia, goal LDL below 100 10/18/2014    REFERRING PROVIDER: Orma Render, NP  REFERRING DIAG:  M72.2 (ICD-10-CM) - Bilateral plantar fasciitis  M54.42,G89.29 (ICD-10-CM) - Chronic bilateral low back pain with left-sided sciatica   Rationale for Evaluation and Treatment Rehabilitation  THERAPY DIAG:  Bilateral plantar fasciitis  Chronic bilateral low back pain with left-sided sciatica  ONSET DATE: chronic with fluctuations in pain  SUBJECTIVE:                                                                                                                                                                                            SUBJECTIVE STATEMENT: Athlete all my life, was a runner. [place] a couple times and accidents when I was younger and had PT and got better. In the last year started having a hard time sleeping. I love body pump and noticed 10lb or more overhead lifting is apinful. Workout 5-6 days/week. Stabbing in Lt lower back and pain all the way up. Tore my Rt hip labrum running about 3 years ago. Pain in lleft hamstrings when running.  Both feet hurt about 20 years ago and  I think its from wearing heels too often. I moved into a new house about 3 years ago and it has not gone away. I dont ever walk barefoot.   PERTINENT HISTORY:  ACLR Rt 1990, known Rt labral tear  PAIN:  Are you having pain? Yes: NPRS scale: pretty severe/10 Pain location: Rt lower back Pain description: stabbing, aching Aggravating factors: running Relieving factors: stretching, meds   PRECAUTIONS: None  WEIGHT BEARING RESTRICTIONS No  FALLS:  Has patient fallen in last 6 months? 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  PLOF: Independent  PATIENT GOALS run, decrease pain   OBJECTIVE:   MUSCLE LENGTH: WFL  POSTURE: tends to stand with Lt foot back in wide tandem  PALPATION: Rt post innom rotation  LUMBAR ROM:   Limited hip hinge- bending through lumbar sine   LOWER EXTREMITY MMT:    MMT Right eval Left eval  Hip flexion    Hip extension    Hip abduction    Hip adduction    Hip internal rotation    Hip external rotation    Knee flexion    Knee extension    Ankle dorsiflexion    Ankle plantarflexion    Ankle inversion    Ankle eversion     (Blank rows = not tested)   GAIT: Guarded through Rt hip in running  Reports she is trying to stay on the balls of her feet- notable eversion of Lt ankle    TODAY'S  TREATMENT  MET- Rt hip flexors/Lt extensors Prone PA to Rt upper sacral quadrant Hooklying iso add with ab set Seated and standing abdominal engagement- examples of engagement during workout motions   PATIENT EDUCATION:  Education details: Anatomy of condition, POC, HEP, exercise form/rationale Person educated: Patient Education method: Explanation, Demonstration, Tactile cues, and Verbal cues Education comprehension: verbalized understanding, returned demonstration, verbal cues required, tactile cues required, and needs further education   HOME EXERCISE PROGRAM: Abdominal engagement in workouts  ASSESSMENT:  CLINICAL IMPRESSION: Patient is a 46 y.o. F who was seen today for physical therapy evaluation and treatment for chronic LBP and bil plantar fasciitis. She is very active and does [employer] 6 days/week, she likes to run but has backed off since tearing her Rt hip labrum. Able to demo proper hip hinge following manual therapy today and MET. Has difficulty engaging deep abdominal layers and relies on rectus abdominis and lifts her head in core engagement. Will benefit from skilled PT to address deficits and meet LTGs.     OBJECTIVE IMPAIRMENTS Abnormal gait, decreased activity tolerance, decreased strength, increased muscle spasms, improper body mechanics, postural dysfunction, and pain.   ACTIVITY LIMITATIONS carrying, lifting, bending, sitting, standing, squatting, stairs, and locomotion level  PARTICIPATION LIMITATIONS: meal prep, cleaning, laundry, and fitness  PERSONAL FACTORS 1-2 comorbidities: rt hip labral tear, h/o ACLR  are also affecting patient's functional outcome.   REHAB POTENTIAL: Good  CLINICAL DECISION MAKING: Stable/uncomplicated  EVALUATION COMPLEXITY: Low   GOALS: Goals reviewed with patient? Yes  SHORT TERM GOALS: Target date: 12/15/2021  Level pelvis Baseline: rotated at eval Goal status: INITIAL  2.  Able to engage deep abdominal  musculature without cues Baseline: cues required at eval Goal status: INITIAL   LONG TERM GOALS: Target date: 01/16/2022  Bil hip abd strength within age-appropriate pressure via dynamometry Baseline: not tested at eval Goal status: INITIAL  2.  Able to participate  in body pump and barre without limitation by LBP Baseline:  Goal status: INITIAL  3.  Will be independent in long term core strengthening exercise with proper contractions Baseline:  Goal status: INITIAL  4.  Resolution of plantar fascia pain Baseline:  Goal status: INITIAL    PLAN: PT FREQUENCY: 1x/week-due to copay  PT DURATION: 8 weeks  PLANNED INTERVENTIONS: Therapeutic exercises, Therapeutic activity, Neuromuscular re-education, Balance training, Gait training, Patient/Family education, Self Care, Joint mobilization, Aquatic Therapy, Dry Needling, Electrical stimulation, Spinal mobilization, Cryotherapy, Moist heat, Taping, Traction, Manual therapy, and Re-evaluation.  PLAN FOR NEXT SESSION: possible DN, recheck pelvic alignment, lumbopelvic stability  Malachai Schalk C. Fremont Skalicky PT, DPT 11/17/21 4:32 PM  PHYSICAL THERAPY DISCHARGE SUMMARY  Visits from Start of Care: 1  Current functional level related to goals / functional outcomes: See above   Remaining deficits: See above   Education / Equipment: Anatomy of condition, POC, HEP, exercise form/rationale    Patient agrees to discharge. Patient goals were not met. Patient is being discharged due to not returning since the last visit.

## 2021-11-19 ENCOUNTER — Telehealth (HOSPITAL_BASED_OUTPATIENT_CLINIC_OR_DEPARTMENT_OTHER): Payer: Self-pay

## 2021-11-19 NOTE — Telephone Encounter (Signed)
Patient called and would like a script for a mattress, her insurance will cover this for her. Due to her back pain. Please advise.

## 2021-11-21 ENCOUNTER — Other Ambulatory Visit (HOSPITAL_BASED_OUTPATIENT_CLINIC_OR_DEPARTMENT_OTHER): Payer: Self-pay | Admitting: Nurse Practitioner

## 2021-11-21 DIAGNOSIS — A6000 Herpesviral infection of urogenital system, unspecified: Secondary | ICD-10-CM | POA: Insufficient documentation

## 2021-11-21 DIAGNOSIS — G8929 Other chronic pain: Secondary | ICD-10-CM

## 2021-11-21 DIAGNOSIS — Z9889 Other specified postprocedural states: Secondary | ICD-10-CM

## 2021-11-21 MED ORDER — AMBULATORY NON FORMULARY MEDICATION: 0 refills | Status: DC

## 2021-11-25 ENCOUNTER — Encounter (HOSPITAL_BASED_OUTPATIENT_CLINIC_OR_DEPARTMENT_OTHER): Payer: Self-pay

## 2021-11-25 NOTE — Telephone Encounter (Signed)
Mychart sent to patient.

## 2021-11-27 ENCOUNTER — Encounter (HOSPITAL_BASED_OUTPATIENT_CLINIC_OR_DEPARTMENT_OTHER): Payer: Self-pay | Admitting: Physical Therapy

## 2021-11-27 ENCOUNTER — Ambulatory Visit (HOSPITAL_BASED_OUTPATIENT_CLINIC_OR_DEPARTMENT_OTHER): Payer: BC Managed Care – PPO | Admitting: Physical Therapy

## 2021-11-27 DIAGNOSIS — M5442 Lumbago with sciatica, left side: Secondary | ICD-10-CM | POA: Diagnosis not present

## 2021-11-27 DIAGNOSIS — G8929 Other chronic pain: Secondary | ICD-10-CM | POA: Diagnosis not present

## 2021-11-27 DIAGNOSIS — M722 Plantar fascial fibromatosis: Secondary | ICD-10-CM

## 2021-11-27 NOTE — Therapy (Signed)
OUTPATIENT PHYSICAL THERAPY THORACOLUMBAR TREATMENT   Patient Name: Evelyn Sellers MRN: [id number] DOB:06/12/1975, 46 y.o., female Today's Date: 11/27/2021   PT End of Session - 11/27/21 1304     Visit Number 2    Number of Visits 17    Date for PT Re-Evaluation 01/16/22    Authorization Type BCBS    PT Start Time 1145    PT Stop Time 1230    PT Time Calculation (min) 45 min    Activity Tolerance Patient tolerated treatment well    Behavior During Therapy [facility] for tasks assessed/performed              Past Medical History:  Diagnosis Date   Abdominal pregnancy with intrauterine pregnancy    Abdominal pregnancy with intrauterine pregnancy 03/17/2010   Qualifier: Diagnosis of  By: Rodena Medin MD, Acie Fredrickson    Gestational diabetes    Hyperlipemia    Past Surgical History:  Procedure Laterality Date   ANTERIOR CRUCIATE LIGAMENT REPAIR     Patient Active Problem List   Diagnosis Date Noted   Genital herpes simplex 11/21/2021   Chronic bilateral low back pain with left-sided sciatica 10/29/2021   Abdominal obesity and metabolic syndrome 10/29/2021   Attention deficit hyperactivity disorder (ADHD), predominantly inattentive type 06/03/2021   Weight gain 06/03/2021   Bilateral plantar fasciitis 11/16/2018   Pulmonary nodules/lesions, multiple 10/27/2018   Family history of coronary artery disease in grandmother 09/06/2018   Osteoarthritis of right hip 06/09/2017   Chest pain 10/18/2014   Dyslipidemia, goal LDL below 100 10/18/2014   Low grade squamous intraepithelial lesion (LGSIL) on cervicovaginal cytologic smear 05/30/2012    REFERRING PROVIDER: Tollie Eth, NP  REFERRING DIAG:  M72.2 (ICD-10-CM) - Bilateral plantar fasciitis  M54.42,G89.29 (ICD-10-CM) - Chronic bilateral low back pain with left-sided sciatica   Rationale for Evaluation and Treatment Rehabilitation  THERAPY DIAG:  Bilateral plantar fasciitis  Chronic bilateral low back pain with left-sided  sciatica  ONSET DATE: chronic with fluctuations in pain  SUBJECTIVE:                                                                                                                                                                                           SUBJECTIVE STATEMENT:  Pt states that the pain is inflammed today. She has tried some stretching videos. Sleeping is still hard and painful. Pt lifts 5x/week and moderate to heavy weight    Eval: Athlete all my life, was a runner. [place] a couple times and accidents when I was younger and had PT and got better. In the last year started having a hard time  sleeping. I love body pump and noticed 10lb or more overhead lifting is apinful. Workout 5-6 days/week. Stabbing in Lt lower back and pain all the way up. Tore my Rt hip labrum running about 3 years ago. Pain in lleft hamstrings when running.  Both feet hurt about 20 years ago and I think its from wearing heels too often. I moved into a new house about 3 years ago and it has not gone away. I dont ever walk barefoot.   PERTINENT HISTORY:  ACLR Rt 1990, known Rt labral tear  PAIN:  Are you having pain? Yes: NPRS scale: 7/10 Pain location: L lower back Pain description: stabbing, aching Aggravating factors: running Relieving factors: stretching, meds   PRECAUTIONS: None  WEIGHT BEARING RESTRICTIONS No  FALLS:  Has patient fallen in last 6 months? 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 S/S of pneumothorax if dry needled over a lung field, and to seek immediate medical attention should they occur.  Patient verbalized understanding of these instructions and education.   Patient Consent Given: Yes Education (verbally)provided: Yes Muscles treated: L lumbar multifidi and paraspinals Electrical stimulation performed: N/A Treatment response/outcome: improved soft tissue extensibility, LTR elicited    L L3-5 UPA grade III STM L L/S paraspinals and QL  - Side Plank with Clam  - 1 x daily - 3-4 x weekly - 2 sets - 10 reps - 3 hold - Knee Extension with Weight Machine  - 1 x daily - 3-4 x weekly - 2 sets - 10 reps - Single Leg Press  - 1 x daily - 3-4 x weekly - 3 sets - 10 reps - Standing Bilateral Heel Raise on Step  - 1 x daily - 7 x weekly - 1 sets - 15-20 reps - 3s hold -Cat/Cow 10x    PATIENT EDUCATION:  Education details: anatomy, thermotherapy, exercise progression, strength deficits for impact loading, muscle firing,  envelope of function, HEP, POC  Person educated: Patient Education method: Explanation, Demonstration, Tactile cues, and Verbal cues Education comprehension: verbalized understanding, returned demonstration, verbal cues required, tactile cues required, and needs further education   HOME EXERCISE PROGRAM:  Exercises - Side Plank with Clam  - 1 x daily - 3-4 x weekly - 2 sets - 10 reps - 3 hold - Knee Extension with Weight Machine  - 1 x daily - 3-4 x weekly - 2 sets - 10 reps - Single Leg Press  - 1 x daily - 3-4 x weekly - 3 sets - 10 reps - Standing Bilateral Heel Raise on Step  - 1 x daily - 7 x weekly - 1 sets - 15-20 reps - 3s hold  ASSESSMENT:  CLINICAL IMPRESSION: Pt with improvement in L/S pain today following manual therapy and TPDN and able to follow up with lumbopelvic stability exercise without increasing pain. Pt does have R knee extensor weakness that is likely from history of ACLR. Given pt's lifting and running, potential contribution from R LE weakness causing L sided LBP. Pt HEP updated today and edu given to focus on unilateral strength as  well as pelvic  stability exercise. Pt advised to bring in workout program at next session for review. Pt would benefit from continued skilled therapy in order to reach goals and maximize functional lumobpelvic strength and ROM for full return to PLOF.    OBJECTIVE IMPAIRMENTS Abnormal gait, decreased activity tolerance, decreased strength, increased muscle spasms, improper body mechanics, postural dysfunction, and pain.   ACTIVITY LIMITATIONS carrying, lifting, bending, sitting, standing, squatting, stairs, and locomotion level  PARTICIPATION LIMITATIONS: meal prep, cleaning, laundry, and fitness  PERSONAL FACTORS 1-2 comorbidities: rt hip labral tear, h/o ACLR  are also affecting patient's functional outcome.   REHAB POTENTIAL: Good  CLINICAL DECISION MAKING: Stable/uncomplicated  EVALUATION COMPLEXITY: Low   GOALS: Goals reviewed with patient? Yes  SHORT TERM GOALS: Target date: 12/15/2021  Level pelvis Baseline: rotated at eval Goal status: INITIAL  2.  Able to engage deep abdominal musculature without cues Baseline: cues required at eval Goal status: INITIAL   LONG TERM GOALS: Target date: 01/16/2022  Bil hip abd strength within age-appropriate pressure via dynamometry Baseline: not tested at eval Goal status: INITIAL  2.  Able to participate in body pump and barre without limitation by LBP Baseline:  Goal status: INITIAL  3.  Will be independent in long term core strengthening exercise with proper contractions Baseline:  Goal status: INITIAL  4.  Resolution of plantar fascia pain Baseline:  Goal status: INITIAL    PLAN: PT FREQUENCY: 1x/week-due to copay  PT DURATION: 8 weeks  PLANNED INTERVENTIONS: Therapeutic exercises, Therapeutic activity, Neuromuscular re-education, Balance training, Gait training, Patient/Family education, Self Care, Joint mobilization, Aquatic Therapy, Dry Needling, Electrical stimulation, Spinal mobilization, Cryotherapy, Moist  heat, Taping, Traction, Manual therapy, and Re-evaluation.  PLAN FOR NEXT SESSION: possible DN, lumbopelvic stability  Daleen Bo PT, DPT 11/27/21 1:30 PM

## 2021-12-09 ENCOUNTER — Ambulatory Visit (HOSPITAL_BASED_OUTPATIENT_CLINIC_OR_DEPARTMENT_OTHER): Payer: BC Managed Care – PPO | Attending: Nurse Practitioner | Admitting: Physical Therapy

## 2021-12-09 DIAGNOSIS — G8929 Other chronic pain: Secondary | ICD-10-CM | POA: Insufficient documentation

## 2021-12-09 DIAGNOSIS — M722 Plantar fascial fibromatosis: Secondary | ICD-10-CM | POA: Insufficient documentation

## 2021-12-09 DIAGNOSIS — M5442 Lumbago with sciatica, left side: Secondary | ICD-10-CM | POA: Insufficient documentation

## 2021-12-17 ENCOUNTER — Ambulatory Visit (INDEPENDENT_AMBULATORY_CARE_PROVIDER_SITE_OTHER): Payer: BC Managed Care – PPO | Admitting: Nurse Practitioner

## 2021-12-22 ENCOUNTER — Ambulatory Visit (HOSPITAL_BASED_OUTPATIENT_CLINIC_OR_DEPARTMENT_OTHER): Payer: BC Managed Care – PPO | Admitting: Physical Therapy

## 2021-12-22 NOTE — Therapy (Incomplete)
OUTPATIENT PHYSICAL THERAPY THORACOLUMBAR TREATMENT   Patient Name: Evelyn Sellers MRN: [id number] DOB:09-Aug-1975, 46 y.o., female Today's Date: 12/22/2021      Past Medical History:  Diagnosis Date   Abdominal pregnancy with intrauterine pregnancy    Abdominal pregnancy with intrauterine pregnancy 03/17/2010   Qualifier: Diagnosis of  By: Elizebeth Koller MD, Mora Appl    Gestational diabetes    Hyperlipemia    Past Surgical History:  Procedure Laterality Date   ANTERIOR CRUCIATE LIGAMENT REPAIR     Patient Active Problem List   Diagnosis Date Noted   Genital herpes simplex 11/21/2021   Chronic bilateral low back pain with left-sided sciatica 10/29/2021   Abdominal obesity and metabolic syndrome 63/03/6008   Attention deficit hyperactivity disorder (ADHD), predominantly inattentive type 06/03/2021   Weight gain 06/03/2021   Bilateral plantar fasciitis 11/16/2018   Pulmonary nodules/lesions, multiple 10/27/2018   Family history of coronary artery disease in grandmother 09/06/2018   Osteoarthritis of right hip 06/09/2017   Chest pain 10/18/2014   Dyslipidemia, goal LDL below 100 10/18/2014   Low grade squamous intraepithelial lesion (LGSIL) on cervicovaginal cytologic smear 05/30/2012    REFERRING PROVIDER: Orma Render, NP  REFERRING DIAG:  M72.2 (ICD-10-CM) - Bilateral plantar fasciitis  M54.42,G89.29 (ICD-10-CM) - Chronic bilateral low back pain with left-sided sciatica   Rationale for Evaluation and Treatment Rehabilitation  THERAPY DIAG:  No diagnosis found.  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 [place] a couple times and accidents when I was younger and had PT and got better. In the  last year started having a hard time sleeping. I love body pump and noticed 10lb or more overhead lifting is apinful. Workout 5-6 days/week. Stabbing in Lt lower back and pain all the way up. Tore my Rt hip labrum running about 3 years ago. Pain in lleft hamstrings when running.  Both feet hurt about 20 years ago and I think its from wearing heels too often. I moved into a new house about 3 years ago and it has not gone away. I dont ever walk barefoot.   PERTINENT HISTORY:  ACLR Rt 1990, known Rt labral tear  PAIN:  Are you having pain? Yes: NPRS scale: 7/10 Pain location: L lower back Pain description: stabbing, aching Aggravating factors: running Relieving factors: stretching, meds   PRECAUTIONS:  None  WEIGHT BEARING RESTRICTIONS No  FALLS:  Has patient fallen in last 6 months? 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 S/S of pneumothorax if dry needled over a lung field, and to seek immediate medical attention should they occur. Patient verbalized understanding of these instructions and education.   Patient Consent Given: Yes Education (verbally)provided: Yes Muscles treated: L lumbar multifidi and paraspinals Electrical stimulation performed: N/A Treatment response/outcome: improved soft tissue extensibility, LTR elicited    L L3-5 UPA grade III STM L L/S paraspinals and QL  - Side Plank with Clam  - 1 x daily - 3-4 x weekly - 2 sets - 10 reps - 3 hold - Knee Extension with Weight Machine  - 1 x daily - 3-4 x weekly - 2 sets - 10  reps - Single Leg Press  - 1 x daily - 3-4 x weekly - 3 sets - 10 reps - Standing Bilateral Heel Raise on Step  - 1 x daily - 7 x weekly - 1 sets - 15-20 reps - 3s hold -Cat/Cow 10x    PATIENT EDUCATION:  Education details: anatomy, thermotherapy, exercise progression, strength deficits for impact loading, muscle firing,  envelope of function, HEP, POC  Person educated: Patient Education method: Explanation, Demonstration, Tactile cues, and Verbal cues Education comprehension: verbalized understanding, returned demonstration, verbal cues required, tactile cues required, and needs further education   HOME EXERCISE PROGRAM:  Exercises - Side Plank with Clam  - 1 x daily - 3-4 x weekly - 2 sets - 10 reps - 3 hold - Knee Extension with Weight Machine  - 1 x daily - 3-4 x weekly - 2 sets - 10 reps - Single Leg Press  - 1 x daily - 3-4 x weekly - 3 sets - 10 reps - Standing Bilateral Heel Raise on Step  - 1 x daily - 7 x weekly - 1 sets - 15-20 reps - 3s hold  ASSESSMENT:  CLINICAL IMPRESSION: ***    OBJECTIVE IMPAIRMENTS Abnormal gait, decreased activity tolerance, decreased strength, increased muscle spasms, improper body mechanics, postural dysfunction, and pain.   ACTIVITY LIMITATIONS carrying, lifting, bending, sitting, standing, squatting, stairs, and locomotion level  PARTICIPATION LIMITATIONS: meal prep, cleaning, laundry, and fitness  PERSONAL FACTORS 1-2 comorbidities: rt hip labral tear, h/o ACLR  are also affecting patient's functional outcome.   REHAB POTENTIAL: Good  CLINICAL DECISION MAKING: Stable/uncomplicated  EVALUATION COMPLEXITY: Low   GOALS: Goals reviewed with patient? Yes  SHORT TERM GOALS: Target date: 12/15/2021  Level pelvis Baseline: rotated at eval Goal status: INITIAL  2.  Able to engage deep abdominal musculature without cues Baseline: cues required at eval Goal status: INITIAL   LONG TERM GOALS: Target date: 01/16/2022  Bil hip  abd strength within age-appropriate pressure via dynamometry Baseline: not tested at eval Goal status: INITIAL  2.  Able to participate in body pump and barre  without limitation by LBP Baseline:  Goal status: INITIAL  3.  Will be independent in long term core strengthening exercise with proper contractions Baseline:  Goal status: INITIAL  4.  Resolution of plantar fascia pain Baseline:  Goal status: INITIAL    PLAN: PT FREQUENCY: 1x/week-due to copay  PT DURATION: 8 weeks  PLANNED INTERVENTIONS: Therapeutic exercises, Therapeutic activity, Neuromuscular re-education, Balance training, Gait training, Patient/Family education, Self Care, Joint mobilization, Aquatic Therapy, Dry Needling, Electrical stimulation, Spinal mobilization, Cryotherapy, Moist heat, Taping, Traction, Manual therapy, and Re-evaluation.  PLAN FOR NEXT SESSION: possible DN, lumbopelvic stability  Sherise Geerdes C. Renia Mikelson PT, DPT 12/22/21 10:19 AM

## 2021-12-25 IMAGING — CT CT MAXILLOFACIAL W/O CM
1 series · 15 of 30 positions shown, 19 images · non-contrast
Comparison: No pertinent prior exams available for comparison.

CLINICAL DATA: Chronic sinusitis.  Recurrent sinusitis.

EXAM:
CT MAXILLOFACIAL WITHOUT CONTRAST
TECHNIQUE: Multidetector CT images of the paranasal sinuses were obtained using
the standard protocol without intravenous contrast.

[Series 4: soft tissue · axial · 0.41mm/px · z∈[-118,-5]mm · 15 of 123 slices shown, 19 images]
[im 5/123  brain]
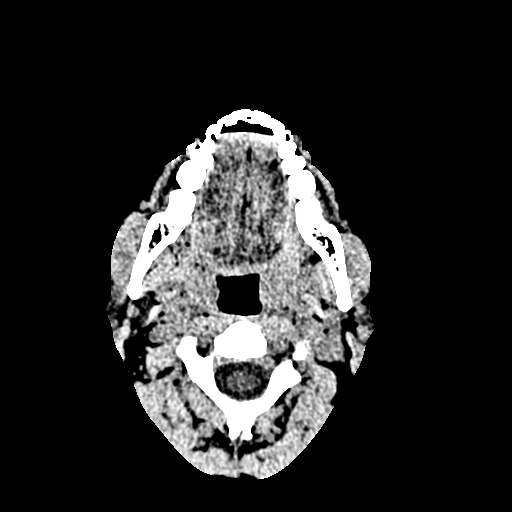
[im 5/123  bone]
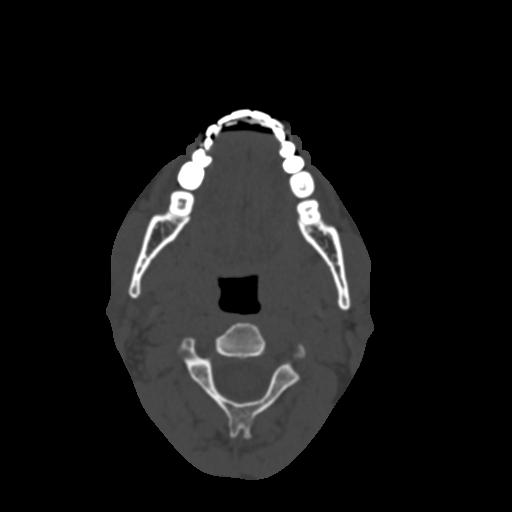
[im 13/123  bone]
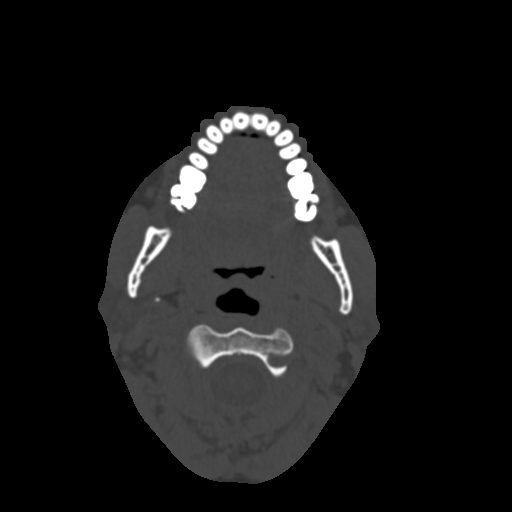
[im 22/123  bone]
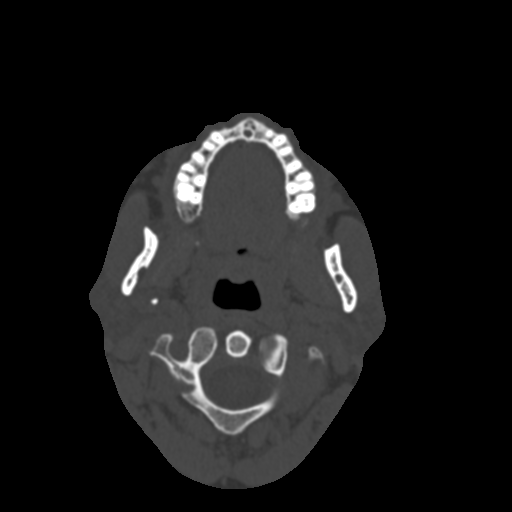
[im 30/123  bone]
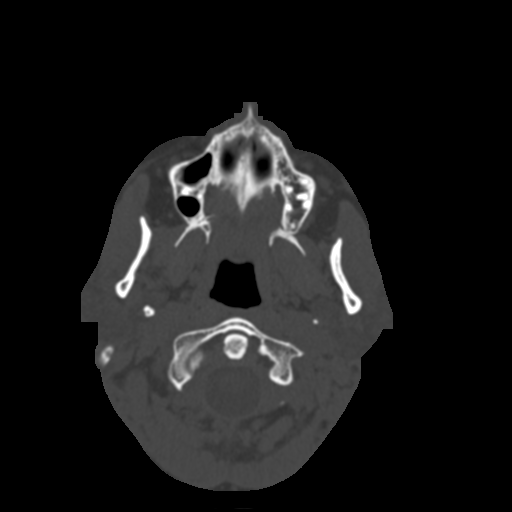
[im 38/123  brain]
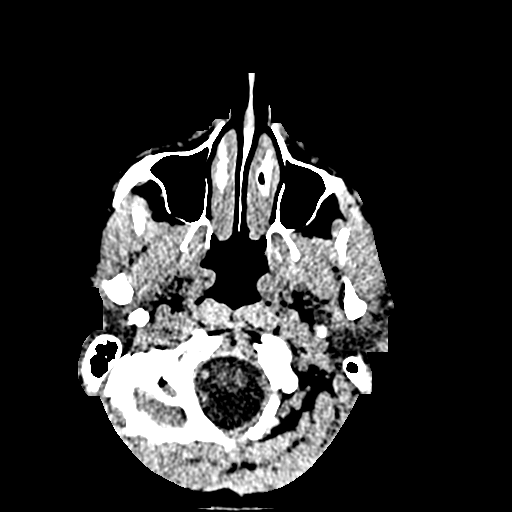
[im 38/123  bone]
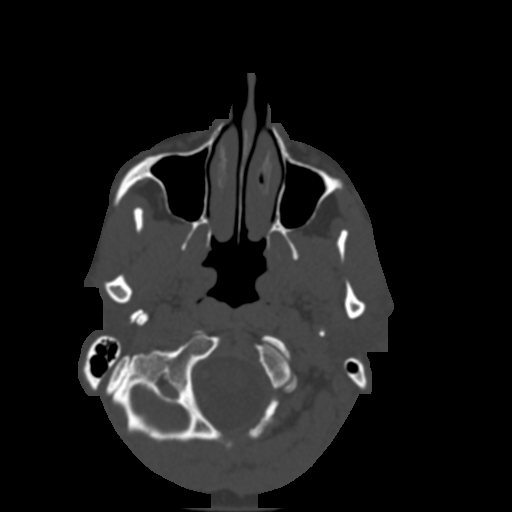
[im 47/123  bone]
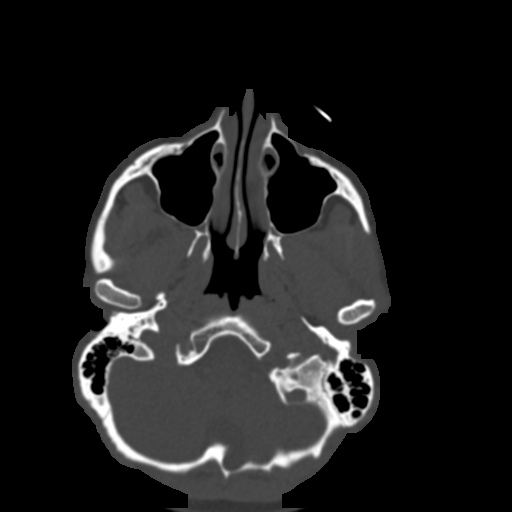
[im 55/123  bone]
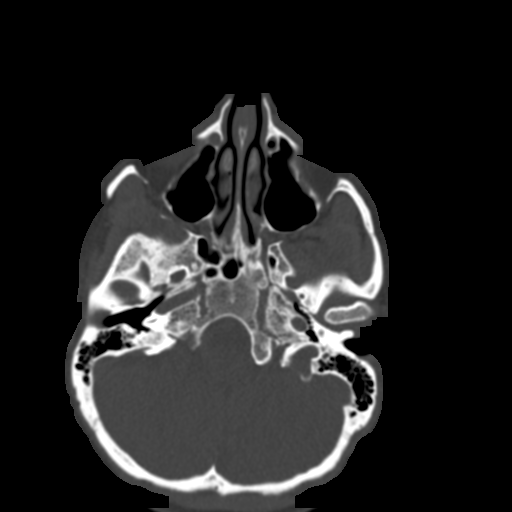
[im 64/123  bone]
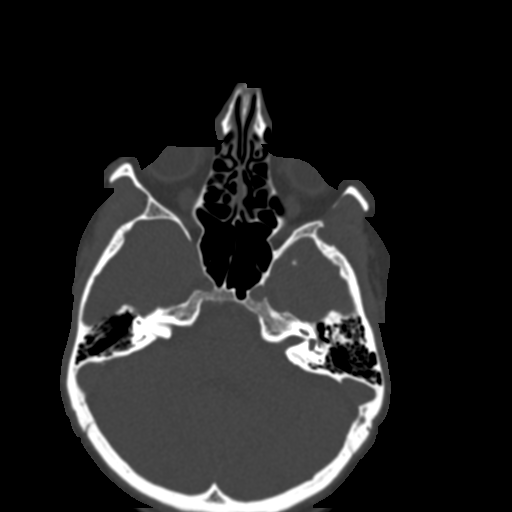
[im 68/123  brain]
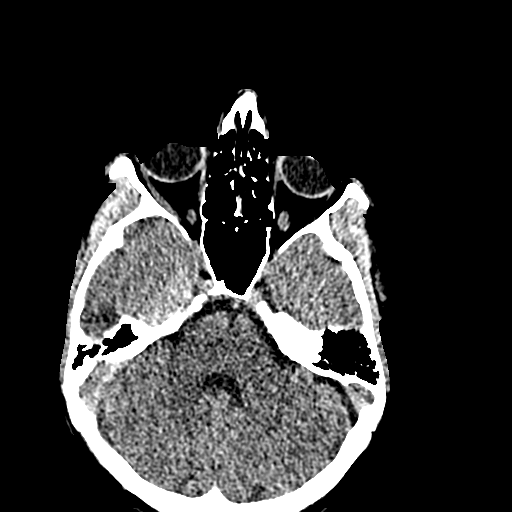
[im 68/123  bone]
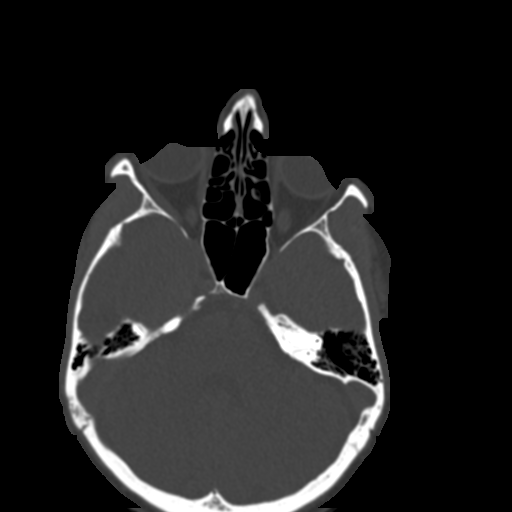
[im 76/123  bone]
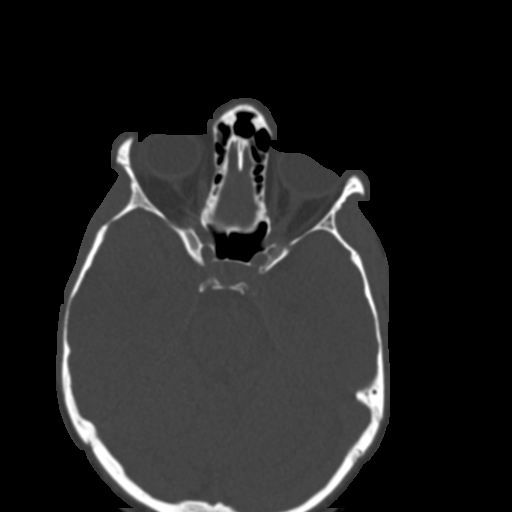
[im 85/123  bone]
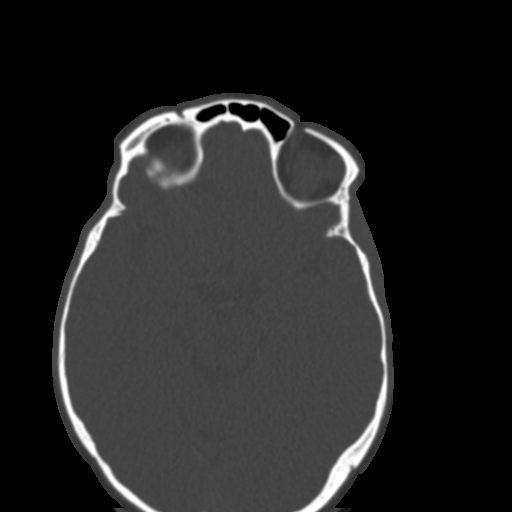
[im 93/123  bone]
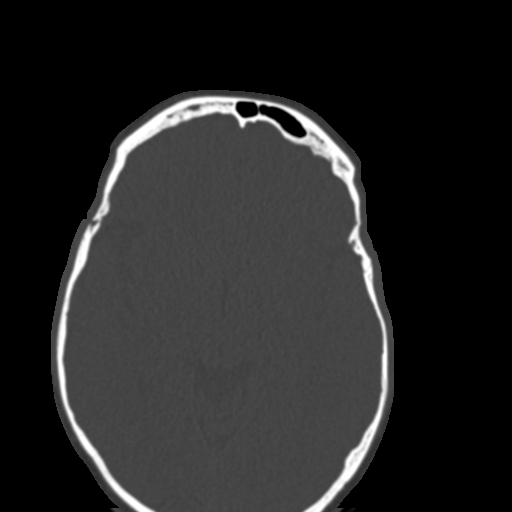
[im 101/123  brain]
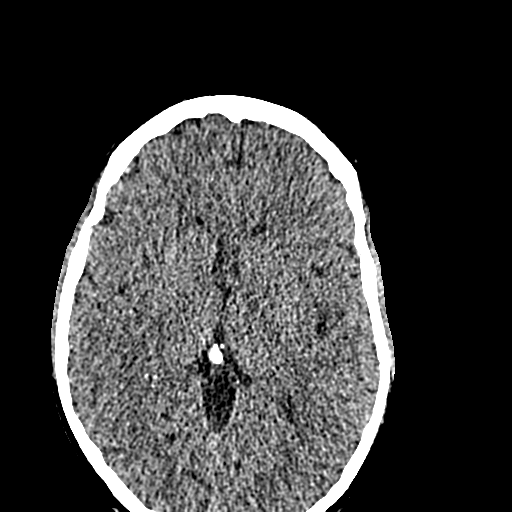
[im 101/123  bone]
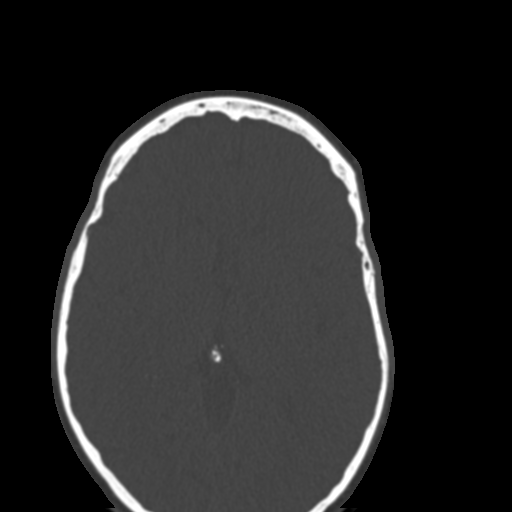
[im 110/123  bone]
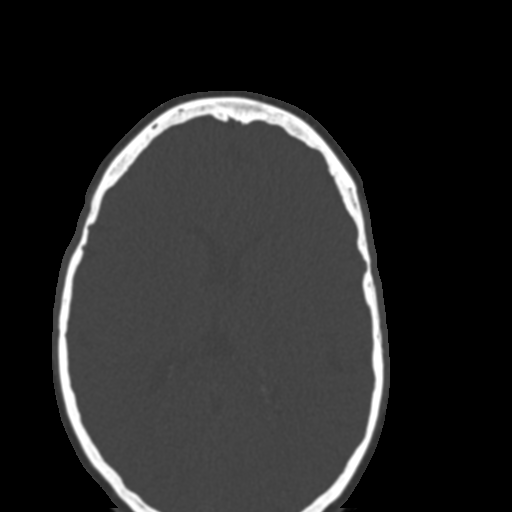
[im 118/123  bone]
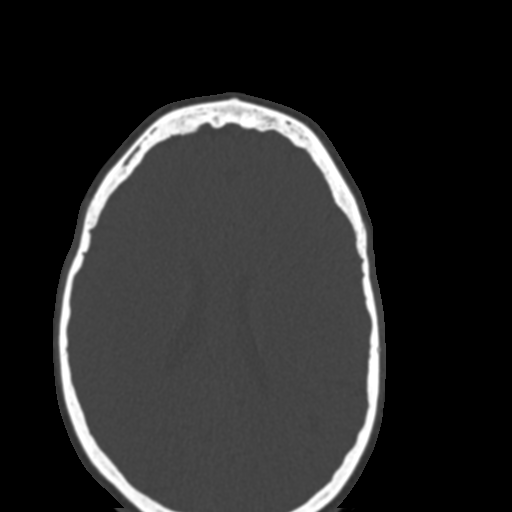

[15 of 30 positions shown; findings below may reference images not displayed]

FINDINGS: Paranasal sinuses:

Frontal: Normally aerated. Patent frontal sinus drainage pathways.

Ethmoid: Trace scattered bilateral ethmoid sinus mucosal thickening.

Maxillary: Normally aerated.

Sphenoid: Normally aerated. Patent sphenoethmoidal recesses.

Right ostiomeatal unit: Patent.

Left ostiomeatal unit: Patent.

Nasal passages: Patent. Mild rightward deviation of the bony nasal
septum.

Anatomy: No pneumatization is present superior to the anterior
ethmoid notches. Symmetric and intact olfactory grooves and fovea
ethmoidalis, Keros II (4-7mm). Sellar sphenoid pneumatization
pattern.
IMPRESSION: Trace scattered mucosal thickening within the bilateral ethmoid
sinuses.

The paranasal sinuses are otherwise normally aerated. Patent sinus
drainage pathways.

Mild rightward deviation of the bony nasal septum.

## 2021-12-30 NOTE — Progress Notes (Signed)
Patient not seen.

## 2022-01-14 DIAGNOSIS — R5383 Other fatigue: Secondary | ICD-10-CM | POA: Diagnosis not present

## 2022-01-14 DIAGNOSIS — N951 Menopausal and female climacteric states: Secondary | ICD-10-CM | POA: Diagnosis not present

## 2022-01-14 DIAGNOSIS — Z131 Encounter for screening for diabetes mellitus: Secondary | ICD-10-CM | POA: Diagnosis not present

## 2022-01-14 DIAGNOSIS — E7800 Pure hypercholesterolemia, unspecified: Secondary | ICD-10-CM | POA: Diagnosis not present

## 2022-01-16 DIAGNOSIS — Z87898 Personal history of other specified conditions: Secondary | ICD-10-CM | POA: Diagnosis not present

## 2022-01-16 DIAGNOSIS — Z6824 Body mass index (BMI) 24.0-24.9, adult: Secondary | ICD-10-CM | POA: Diagnosis not present

## 2022-01-16 DIAGNOSIS — N951 Menopausal and female climacteric states: Secondary | ICD-10-CM | POA: Diagnosis not present

## 2022-01-16 DIAGNOSIS — E7800 Pure hypercholesterolemia, unspecified: Secondary | ICD-10-CM | POA: Diagnosis not present

## 2022-01-20 DIAGNOSIS — Z01419 Encounter for gynecological examination (general) (routine) without abnormal findings: Secondary | ICD-10-CM | POA: Diagnosis not present

## 2022-01-20 DIAGNOSIS — Z1231 Encounter for screening mammogram for malignant neoplasm of breast: Secondary | ICD-10-CM | POA: Diagnosis not present

## 2022-01-20 DIAGNOSIS — Z124 Encounter for screening for malignant neoplasm of cervix: Secondary | ICD-10-CM | POA: Diagnosis not present

## 2022-01-20 DIAGNOSIS — R87610 Atypical squamous cells of undetermined significance on cytologic smear of cervix (ASC-US): Secondary | ICD-10-CM | POA: Insufficient documentation

## 2022-01-20 DIAGNOSIS — Z6822 Body mass index (BMI) 22.0-22.9, adult: Secondary | ICD-10-CM | POA: Diagnosis not present

## 2022-01-26 LAB — HM PAP SMEAR: HPV, high-risk: NEGATIVE

## 2022-03-25 ENCOUNTER — Telehealth: Payer: Self-pay | Admitting: Nurse Practitioner

## 2022-03-25 DIAGNOSIS — F900 Attention-deficit hyperactivity disorder, predominantly inattentive type: Secondary | ICD-10-CM

## 2022-03-25 NOTE — Telephone Encounter (Signed)
Pt needs refill Ritalin to [facility]  

## 2022-03-26 ENCOUNTER — Telehealth: Payer: Self-pay | Admitting: Nurse Practitioner

## 2022-03-26 DIAGNOSIS — F900 Attention-deficit hyperactivity disorder, predominantly inattentive type: Secondary | ICD-10-CM

## 2022-03-26 MED ORDER — METHYLPHENIDATE HCL 10 MG PO TABS: 20.0000 mg | ORAL_TABLET | Freq: Two times a day (BID) | ORAL | 0 refills | Status: DC

## 2022-03-26 NOTE — Telephone Encounter (Signed)
Refill sent.

## 2022-03-26 NOTE — Telephone Encounter (Signed)
Pharmacy sent request for change of ritlan  Insurance will only pay for 3 tablets  a day not 4. Can you write for 20mg tablet in am and 20mg tablet at lunch?   

## 2022-03-27 NOTE — Telephone Encounter (Signed)
Pharmacy sent request for change of ritlan  Insurance will only pay for 3 tablets  a day not 4. Can you write for 20mg  tablet in am and 20mg  tablet at lunch?

## 2022-03-31 MED ORDER — METHYLPHENIDATE HCL 20 MG PO TABS: 10.0000 mg | ORAL_TABLET | Freq: Two times a day (BID) | ORAL | 0 refills | Status: DC

## 2022-03-31 NOTE — Addendum Note (Signed)
Addended by: Connelly Spruell, Clarise Cruz E on: 03/31/2022 07:19 AM   Modules accepted: Orders

## 2022-03-31 NOTE — Telephone Encounter (Signed)
Ritalin prescription changed to 10-20mg  every morning and every afternoon with 20mg  tablet due to insurance requirements.

## 2022-04-20 NOTE — Progress Notes (Unsigned)
Office Visit    Patient Name: Joselina Haworth Date of Encounter: 04/21/2022  PCP:  Orma Render, NP   [place]  Cardiologist:  Skeet Latch, MD  Advanced Practice Provider:  No care team member to display Electrophysiologist:  None      Chief Complaint    Tamicka Kispert is a 47 y.o. female presents today for overdue cardiology follow up   Past Medical History    Past Medical History:  Diagnosis Date   Abdominal pregnancy with intrauterine pregnancy    Abdominal pregnancy with intrauterine pregnancy 03/17/2010   Qualifier: Diagnosis of  By: Elizebeth Koller MD, Mora Appl    Gestational diabetes    Hyperlipemia    Past Surgical History:  Procedure Laterality Date   ANTERIOR CRUCIATE LIGAMENT REPAIR      Allergies  Allergies  Allergen Reactions   Demerol [Meperidine] Nausea Only   Statins Other (See Comments)    Myalgia    History of Present Illness    An Dozer is a 47 y.o. female with a hx of hyperlipidemia last seen 01/01/20 by pharmacy team. Family history of CAD in her grandmother.   Prior ETT 03/2015 negative for ischemia. Due to persistent symptoms underwent CT-A 05/2016 with coronary calcium score of 0. Give hyperlipidemia previously started on Atorvastatin, Rosuvastatin but did not tolerate. She saw our pharmacy team 01/2020 and was started on Repatha.   She presents today for cardiology follow up. Pleasant lady who has an 48 year old 28 and 49 year old stepson. Notes she did not take Repatha as not interested in injections. Reports discomfort described as "electric shock" that radiates chest down her arm. Tells me her mother had similar symptoms and had good results with Metoprolol and Amlodipine.  Of note her mother has a history of Prinzmetal angina.  Does note previous symptoms of chest pain were during divorce but these are different. Notes she does not feel stressed but is running for school board office. 2 weeks  ago was sitting at an event when she had  a sensation of an electrical spasm across her chest that radiates down her arm. It happened again at another event and last night. Tells me her arm feels tired.   Previous lipid agents Atorvastatin - myalgias Rosuvastatin - myalgias Red Yeast rice - myalgias  EKGs/Labs/Other Studies Reviewed:   The following studies were reviewed today:  Coronary CTA 06/26/15 IMPRESSION: 1. Coronary artery calcium score of 0 Agatston units suggests low risk of future cardiac events. 2.  No coronary disease noted on this scan. EKG:  EKG is ordered today.  The ekg ordered today demonstrates NSR 63 bpm with PAC and no acute ST/T wave changes.   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   ezetimibe (ZETIA) 10 MG tablet Take 1 tablet (10 mg  total) by mouth daily.   methylphenidate (RITALIN) 20 MG tablet Take 0.5-1 tablets (10-20 mg total) by mouth 2 (two) times daily with breakfast and lunch.   metoprolol succinate (TOPROL-XL) 25 MG 24 hr tablet Take 1 tablet (25 mg total) by mouth daily. Take with or immediately following a meal.   norethindrone-ethinyl estradiol (JUNEL FE,GILDESS FE,LOESTRIN FE) 1-20 MG-MCG tablet Take 1 tablet by mouth daily.     Review of Systems      All other systems reviewed and are otherwise negative except as noted above.  Physical Exam    VS:  BP  120/80   Pulse 63   Ht 5' 4"$  (1.626 m)   Wt 135 lb (61.2 kg)   BMI 23.17 kg/m  , BMI Body mass index is 23.17 kg/m.  Wt Readings from Last 3 Encounters:  04/21/22 135 lb (61.2 kg)  09/16/21 140 lb 11.2 oz (63.8 kg)  05/28/21 142 lb 6.4 oz (64.6 kg)    GEN: Well nourished, well developed, in no acute distress. HEENT: normal. Neck: Supple, no JVD, carotid bruits, or masses. Cardiac: RRR, no murmurs, rubs, or gallops. No clubbing, cyanosis, edema.  Radials/PT 2+ and equal bilaterally.  Respiratory:  Respirations regular and unlabored, clear to auscultation bilaterally. GI: Soft, nontender, nondistended. MS: No deformity or atrophy. Skin: Warm and dry, no rash. Neuro:  Strength and sensation are intact. Psych: Normal affect.  Assessment & Plan    HLD -07/2021 total cholesterol 233, triglycerides 112, HDL 56, LDL 157.  Intolerant to statin including atorvastatin or rosuvastatin.  Also intolerant to red yeast rice. Add Zetia 67m QD as she reports her mother tolerates well.  Repeat CMP, fasting lipid, lipoprotein a 2 to 3 months after starting therapy.  She prefers to avoid injections.  Consider addition of bempedoic acid at follow-up pending her insurance coverage.  [facility] in [facility] Her mother has a history of Prinzmetal angina.  Notes 2-week history of sensation of shock across her chest radiating down her left arm.  It is not exertional and EKG today with no acute ST/T wave changes.  Prior cardiac CTA 2017 with coronary calcium score of 0.  EKG today did show occasional PAC.  Symptoms overall atypical for angina as they occur at rest but does have family history of Prinzmetal angina.  Will Rx metoprolol succinate 25 mg daily.  Check in via MyChart in 1 week.  We discussed that could only evaluate for microvascular angina via PET and she will check in with her insurance regarding cost.  We did discuss possible coronary calcium score for persistent chest discomfort. which would only  be $99 out-of-pocket.          Disposition: Follow up  in 4-6 weeks  with TSkeet Latch MD or APP.  Signed, CLoel Dubonnet NP 04/21/2022, 3:44 PM [place]

## 2022-04-21 ENCOUNTER — Encounter (HOSPITAL_BASED_OUTPATIENT_CLINIC_OR_DEPARTMENT_OTHER): Payer: Self-pay | Admitting: Family

## 2022-04-21 ENCOUNTER — Ambulatory Visit (HOSPITAL_BASED_OUTPATIENT_CLINIC_OR_DEPARTMENT_OTHER): Payer: BC Managed Care – PPO | Admitting: Family

## 2022-04-21 ENCOUNTER — Encounter (HOSPITAL_BASED_OUTPATIENT_CLINIC_OR_DEPARTMENT_OTHER): Payer: Self-pay

## 2022-04-21 VITALS — BP 120/80 | HR 63 | Ht 64.0 in | Wt 135.0 lb

## 2022-04-21 DIAGNOSIS — E785 Hyperlipidemia, unspecified: Secondary | ICD-10-CM

## 2022-04-21 DIAGNOSIS — I491 Atrial premature depolarization: Secondary | ICD-10-CM | POA: Diagnosis not present

## 2022-04-21 MED ORDER — EZETIMIBE 10 MG PO TABS: 10.0000 mg | ORAL_TABLET | Freq: Every day | ORAL | 3 refills | Status: DC

## 2022-04-21 MED ORDER — METOPROLOL SUCCINATE ER 25 MG PO TB24: 25.0000 mg | ORAL_TABLET | Freq: Every day | ORAL | 3 refills | Status: DC

## 2022-04-21 NOTE — Patient Instructions (Addendum)
Medication Instructions:  Your physician has recommended you make the following change in your medication:   START Metoprolol Succinate (Toprol) one 73m tablet daily  START Ezetimibe (Zetia) one 120mtablet daily   *If you need a refill on your cardiac medications before your next appointment, please call your pharmacy*   Lab Work: Your physician recommends that you return for lab work in two to three months for fasting lipid panel, CMP  Please return for Lab work. You may come to the...   [facility] (3rd floor) [address] [id number]: 8am-Noon and 1pm-4:30pm  Please ring the doorbell on the small table when you exit the elevator and the Lab Tech will come get you  [facility] [place] [id number]: 8am-1pm, then 2pm-4:30pm   [facility] CenterPlease see attached locations sheet stapled to your lab work with address and hours.     If you have labs (blood work) drawn today and your tests are completely normal, you will receive your results only by: MyPanguitchif you have MyChart) OR A paper copy in the mail If you have any lab test that is abnormal or we need to change your treatment, we will call you to review the results.   Testing/Procedures: Your EKG today showed normal sinus rhythm with an occasional early heart beat called PAC which is not dangerous and not of concern.   Follow-Up: [facility] are our [facility].  As part of our continuing mission to provide you with exceptional heart care, we have created designated Provider Care Teams.  These Care Teams include your primary Cardiologist (physician) and Advanced Practice Providers (APPs -  Physician Assistants and Nurse Practitioners) who all work together to provide you with the care you need, when you need it.  We recommend signing up for the patient portal called  "MyChart".  Sign up information is provided on this After Visit Summary.  MyChart is used to connect with patients for Virtual Visits (Telemedicine).  Patients are able to view lab/test results, encounter notes, upcoming appointments, etc.  Non-urgent messages can be sent to your provider as well.   To learn more about what you can do with MyChart, go to [email or web address]   Your next appointment:   4-6 week(s)  Provider:   TiSkeet LatchMD or CaLaurann MontanaNP    Other Instructions   Your chest discomfort is atypical for heart disease related pain. You could be having spasms of the heart arteries called 'microvascular angina'. This could also be a symptom of those occasional early beats. The Metoprolol helps to relax your heart and prevent those spasms. We will send a MyChart message in a week to check in. If you are still having symptoms we could consider a coronary calcium score ($99 out of pocket cost).   The more detailed cardiac CTA is $1500 per our review of your insurance, but you could double check with them.   A cardiac PET is really only the scan that would show the microvascular angina and may also be expensive but we can keep it in mind for the future - in the interim, will treat with Metoprolol.   To prevent palpitations: Make sure you are adequately hydrated.  Avoid and/or limit caffeine containing beverages like soda or tea. Exercise regularly.  Manage stress well. Some over the counter medications can cause palpitations such as  Benadryl, AdvilPM, TylenolPM. Regular Advil or Tylenol do not cause palpitations.

## 2022-04-28 ENCOUNTER — Encounter (HOSPITAL_BASED_OUTPATIENT_CLINIC_OR_DEPARTMENT_OTHER): Payer: Self-pay

## 2022-05-26 DIAGNOSIS — M25561 Pain in right knee: Secondary | ICD-10-CM | POA: Diagnosis not present

## 2022-06-02 ENCOUNTER — Ambulatory Visit (HOSPITAL_BASED_OUTPATIENT_CLINIC_OR_DEPARTMENT_OTHER): Payer: BC Managed Care – PPO | Admitting: Family

## 2022-08-05 ENCOUNTER — Telehealth: Payer: Self-pay | Admitting: Nurse Practitioner

## 2022-08-05 DIAGNOSIS — F900 Attention-deficit hyperactivity disorder, predominantly inattentive type: Secondary | ICD-10-CM

## 2022-08-05 MED ORDER — METHYLPHENIDATE HCL 20 MG PO TABS: 10.0000 mg | ORAL_TABLET | Freq: Two times a day (BID) | ORAL | 0 refills | Status: DC

## 2022-08-05 NOTE — Telephone Encounter (Signed)
Pt called and is requesting a refill on her ritalin please send to the  [id number] - [place], [place] - [address]

## 2022-10-05 DIAGNOSIS — D2261 Melanocytic nevi of right upper limb, including shoulder: Secondary | ICD-10-CM | POA: Diagnosis not present

## 2022-10-05 DIAGNOSIS — L720 Epidermal cyst: Secondary | ICD-10-CM | POA: Diagnosis not present

## 2022-10-05 DIAGNOSIS — Z808 Family history of malignant neoplasm of other organs or systems: Secondary | ICD-10-CM | POA: Diagnosis not present

## 2022-10-05 DIAGNOSIS — L814 Other melanin hyperpigmentation: Secondary | ICD-10-CM | POA: Diagnosis not present

## 2022-10-12 ENCOUNTER — Telehealth: Payer: Self-pay | Admitting: Nurse Practitioner

## 2022-10-12 NOTE — Telephone Encounter (Signed)
Patient called and states she was referred to you by Dr.Contogiannis. States she would like to know if you would take her on as a new patient.

## 2022-10-14 NOTE — Telephone Encounter (Signed)
She has Enid Skeens, NP as her PCP. Thx

## 2022-10-15 DIAGNOSIS — L723 Sebaceous cyst: Secondary | ICD-10-CM | POA: Diagnosis not present

## 2022-10-16 DIAGNOSIS — L723 Sebaceous cyst: Secondary | ICD-10-CM | POA: Diagnosis not present

## 2022-10-21 DIAGNOSIS — J029 Acute pharyngitis, unspecified: Secondary | ICD-10-CM | POA: Diagnosis not present

## 2022-10-22 NOTE — Telephone Encounter (Signed)
I called and informed patient, she stated she wanted to do a TOC as she would prefer to have an MD as her PCP.

## 2022-10-23 NOTE — Telephone Encounter (Signed)
Okay.  Thanks.

## 2022-10-30 DIAGNOSIS — J343 Hypertrophy of nasal turbinates: Secondary | ICD-10-CM | POA: Diagnosis not present

## 2022-10-30 DIAGNOSIS — J342 Deviated nasal septum: Secondary | ICD-10-CM | POA: Diagnosis not present

## 2022-10-30 DIAGNOSIS — R0982 Postnasal drip: Secondary | ICD-10-CM | POA: Diagnosis not present

## 2022-10-30 DIAGNOSIS — J310 Chronic rhinitis: Secondary | ICD-10-CM | POA: Diagnosis not present

## 2023-01-11 ENCOUNTER — Ambulatory Visit (INDEPENDENT_AMBULATORY_CARE_PROVIDER_SITE_OTHER): Payer: BC Managed Care – PPO

## 2023-03-03 LAB — HM PAP SMEAR

## 2023-03-24 DIAGNOSIS — Z124 Encounter for screening for malignant neoplasm of cervix: Secondary | ICD-10-CM | POA: Diagnosis not present

## 2023-03-24 DIAGNOSIS — N959 Unspecified menopausal and perimenopausal disorder: Secondary | ICD-10-CM | POA: Diagnosis not present

## 2023-03-24 DIAGNOSIS — Z01419 Encounter for gynecological examination (general) (routine) without abnormal findings: Secondary | ICD-10-CM | POA: Diagnosis not present

## 2023-03-24 DIAGNOSIS — Z6824 Body mass index (BMI) 24.0-24.9, adult: Secondary | ICD-10-CM | POA: Diagnosis not present

## 2023-03-24 DIAGNOSIS — Z1231 Encounter for screening mammogram for malignant neoplasm of breast: Secondary | ICD-10-CM | POA: Diagnosis not present

## 2023-04-06 ENCOUNTER — Encounter: Payer: Self-pay | Admitting: Nurse Practitioner

## 2023-04-06 ENCOUNTER — Ambulatory Visit (INDEPENDENT_AMBULATORY_CARE_PROVIDER_SITE_OTHER): Payer: Self-pay | Admitting: Nurse Practitioner

## 2023-04-06 ENCOUNTER — Other Ambulatory Visit (HOSPITAL_BASED_OUTPATIENT_CLINIC_OR_DEPARTMENT_OTHER): Payer: Self-pay | Admitting: Family

## 2023-04-06 VITALS — BP 112/74 | HR 59 | Wt 146.4 lb

## 2023-04-06 DIAGNOSIS — E8881 Metabolic syndrome: Secondary | ICD-10-CM | POA: Diagnosis not present

## 2023-04-06 DIAGNOSIS — N951 Menopausal and female climacteric states: Secondary | ICD-10-CM | POA: Diagnosis not present

## 2023-04-06 DIAGNOSIS — R635 Abnormal weight gain: Secondary | ICD-10-CM | POA: Diagnosis not present

## 2023-04-06 DIAGNOSIS — I10 Essential (primary) hypertension: Secondary | ICD-10-CM

## 2023-04-06 DIAGNOSIS — E785 Hyperlipidemia, unspecified: Secondary | ICD-10-CM | POA: Diagnosis not present

## 2023-04-06 DIAGNOSIS — E669 Obesity, unspecified: Secondary | ICD-10-CM

## 2023-04-06 DIAGNOSIS — Z8632 Personal history of gestational diabetes: Secondary | ICD-10-CM

## 2023-04-06 DIAGNOSIS — R072 Precordial pain: Secondary | ICD-10-CM

## 2023-04-06 MED ORDER — METOPROLOL SUCCINATE ER 25 MG PO TB24: 25.0000 mg | ORAL_TABLET | Freq: Every day | ORAL | 3 refills | Status: AC

## 2023-04-06 MED ORDER — EZETIMIBE 10 MG PO TABS: 10.0000 mg | ORAL_TABLET | Freq: Every day | ORAL | 3 refills | Status: DC

## 2023-04-06 MED ORDER — NORETHIN ACE-ETH ESTRAD-FE 1-20 MG-MCG(24) PO TABS: 1.0000 | ORAL_TABLET | Freq: Every day | ORAL | 3 refills | Status: DC

## 2023-04-06 NOTE — Assessment & Plan Note (Signed)
 On metoprolol  for stress-induced spasms. Attempted to discontinue but experienced return of symptoms. Discussed possibility of using metoprolol  on an as-needed basis. - Refill metoprolol  prescription. - Consider tapering off metoprolol  and monitor for return of symptoms.

## 2023-04-06 NOTE — Assessment & Plan Note (Signed)
 Familial hyperlipidemia with total cholesterol levels ranging from 220 to 265 since her twenties. Currently on Zetia , which has reduced cholesterol by about 20 points. Good HDL levels and improved LDL levels with Zetia . Explained importance of maintaining a good HDL/LDL ratio to reduce cardiovascular risk. Discussed limited impact of diet and exercise alone. - Continue Zetia .

## 2023-04-06 NOTE — Assessment & Plan Note (Signed)
 Experiencing weight gain and elevated cortisol levels. Managing weight through diet and exercise but finds it challenging. Discussed importance of balancing protein and carbohydrates to prevent blood sugar spikes and subsequent fat storage. Considering semaglutide  for weight management but hesitant about long-term use. Discussed benefits of semaglutide , including normalization of blood sugars, cholesterol, blood pressure, and reduction of fatty liver. Mentioned potential reduction in heart attack, stroke, and dementia risk. - Increase protein intake with meals. - Consider protein shakes like Ensure High Protein or Glycine. - Send list of current supplements for review.

## 2023-04-06 NOTE — Progress Notes (Addendum)
 Evelyn Doing, DNP, AGNP-c [facility]  [address] [place], [place] [address] [phone]  ESTABLISHED PATIENT- Chronic Health and/or Follow-Up Visit  Blood pressure 112/74, pulse (!) 59, weight 146 lb 6.4 oz (66.4 kg), last menstrual period 03/14/2023.    Evelyn Sellers is a 48 y.o. year old female presenting today for evaluation and management of chronic conditions.   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  She has concerns about hormone replacement therapy due to low progesterone  levels found in OBGYN and a family history of heart disease. She is currently prescribed medication for low progesterone  but prefers to explore natural treatment options prior to immediately starting this. She has a history of using low estrogen birth control without side effects and is considering returning to it to manage her periods and hormone levels. She experiences sleep issues and weight gain in her abdomen, for which she is taking supplements and adjusting her diet.  She has a history of high cholesterol since her twenties, likely due to familial hypercholesterolemia, with levels ranging from 220 to 265. She is currently on Zetia , which has reduced her cholesterol by about twenty points. Her HDL levels have improved, and her LDL levels have decreased, resulting in a better cholesterol ratio. Her hemoglobin A1c is low, indicating average blood sugars in the eighties.  She has been on a beta blocker for about a year due to spasms radiating down her arm, similar to her mother's symptoms. She attempted to discontinue the beta blocker but noticed the spasms returned, which she attributes to stress. She is also taking turmeric and other supplements for weight loss and inflammation.  She experiences high stress levels and is attempting dietary changes, including a low-carb,  no-sugar diet, which she finds challenging. She is exploring protein intake to manage her weight and is considering magnesium supplementation. She has tried semaglutide  for weight loss, which was effective but resulted in weight regain after discontinuation. She is attempting lifestyle changes before considering long-term medication use.  Weight Management Addendum: Evelyn Sellers has a history of high blood pressure for which the beta blocker aids in controlling, along with the spasms associated. Her cholesterol is elevated (see above). She has a strong family history of heart disease and diabetes and had gestational diabetes herself while pregnant. Despite a significantly healthy diet with low carbohydrates, low fat, high protein, and high fiber as well as daily cardiovascular activity of a minimum of 30 minutes,  her waist measurement remains at 36 inches. Although her BMI has come down slightly, the concern with predominant abdominal obesity remains.    All ROS negative with exception of what is listed above.   PHYSICAL EXAM Physical Exam Vitals and nursing note reviewed.  Constitutional:      General: She is not in acute distress.    Appearance: Normal appearance. She is normal weight. She is not ill-appearing.  Eyes:     Conjunctiva/sclera: Conjunctivae normal.  Cardiovascular:     Rate and Rhythm: Normal rate and regular rhythm.  Pulmonary:     Effort: Pulmonary effort is normal.  Musculoskeletal:        General: Normal range of motion.     Cervical back: Normal range of motion.  Skin:    General: Skin is warm and dry.  Neurological:     Mental Status: She is alert and oriented to person, place, and time.  Psychiatric:  Mood and Affect: Mood normal.     PLAN Problem List Items Addressed This Visit     Chest pain   On metoprolol  for stress-induced spasms. Attempted to discontinue but experienced return of symptoms. Discussed possibility of using metoprolol  on an as-needed  basis. - Refill metoprolol  prescription. - Consider tapering off metoprolol  and monitor for return of symptoms.      Relevant Medications   metoprolol  succinate (TOPROL -XL) 25 MG 24 hr tablet   Dyslipidemia, goal LDL below 100   Familial hyperlipidemia with total cholesterol levels ranging from 220 to 265 since her twenties. Currently on Zetia , which has reduced cholesterol by about 20 points. Good HDL levels and improved LDL levels with Zetia . Explained importance of maintaining a good HDL/LDL ratio to reduce cardiovascular risk. Discussed limited impact of diet and exercise alone. - Continue Zetia .      Relevant Medications   ezetimibe  (ZETIA ) 10 MG tablet   metoprolol  succinate (TOPROL -XL) 25 MG 24 hr tablet   Weight gain   Experiencing weight gain and elevated cortisol levels. Managing weight through diet and exercise but finds it challenging. Discussed importance of balancing protein and carbohydrates to prevent blood sugar spikes and subsequent fat storage. Considering semaglutide  for weight management but hesitant about long-term use. Discussed benefits of semaglutide , including normalization of blood sugars, cholesterol, blood pressure, and reduction of fatty liver. Mentioned potential reduction in heart attack, stroke, and dementia risk. - Increase protein intake with meals. - Consider protein shakes like Ensure High Protein or Glycine. - Send list of current supplements for review.      Perimenopause - Primary   Low progesterone  levels with concerns about HRT risks, particularly cardiovascular and cancer risks. Prefers natural alternatives or lower risk. Experiencing sleep issues and abdominal weight gain. Previously tolerated low-dose estrogen birth control without side effects and interested in resuming it rather than separate hormones at this time. She is still having periods.  - [facility] [employer].      Relevant Medications    Norethindrone Acetate-Ethinyl Estrad-FE (LOESTRIN 24 FE) 1-20 MG-MCG(24) tablet    Return if symptoms worsen or fail to improve.  Evelyn Doing, DNP, AGNP-c

## 2023-04-06 NOTE — Patient Instructions (Addendum)
 Magnesium Gluconate 500mg  at bedtime can help with sleep.  I have sent in the birth control to help carry you through this perimenopausal phase.   WEIGHT LOSS PLANNING   For best management of weight, it is vital to balance intake versus output. This means the number of calories burned per day must be less than the calories you take in with food and drink.   I recommend trying to follow a diet with the following: Calories: 1200-1500 calories per day Carbohydrates: 150-180 grams of carbohydrates per day  Why: Gives your body enough quick fuel for cells to maintain normal function without sending them into starvation mode.  Protein: At least 90 grams of protein per day- 30 grams with each meal Why: Protein takes longer and uses more energy than carbohydrates to break down for fuel. The carbohydrates in your meals serves as quick energy sources and proteins help use some of that extra quick energy to break down to produce long term energy. This helps you not feel hungry as quickly and protein breakdown burns calories.  Water: Drink AT LEAST 64 ounces of water per day  Why: Water is essential to healthy metabolism. Water helps to fill the stomach and keep you fuller longer. Water is required for healthy digestion and filtering of waste in the body.  Fat: Limit fats in your diet- when choosing fats, choose foods with lower fats content such as lean meats (chicken, fish, turkey).  Why: Increased fat intake leads to storage for later. Once you burn your carbohydrate energy, your body goes into fat and protein breakdown mode to help you loose weight.  Cholesterol: Fats and oils that are LIQUID at room temperature are best. Choose vegetable oils (olive oil, avocado oil, nuts). Avoid fats that are SOLID at room temperature (animal fats, processed meats). Healthy fats are often found in whole grains, beans, nuts, seeds, and berries.  Why: Elevated cholesterol levels lead to build up of cholesterol on the  inside of your blood vessels. This will eventually cause the blood vessels to become hard and can lead to high blood pressure and damage to your organs. When the blood flow is reduced, but the pressure is high from cholesterol buildup, parts of the cholesterol can break off and form clots that can go to the brain or heart leading to a stroke or heart attack.  Fiber: Increase amount of SOLUBLE the fiber in your diet. This helps to fill you up, lowers cholesterol, and helps with digestion. Some foods high in soluble fiber are oats, peas, beans, apples, carrots, barley, and citrus fruits.   Why: Fiber fills you up, helps remove excess cholesterol, and aids in healthy digestion which are all very important in weight management.   I recommend the following as a minimum activity routine: Purposeful walk or other physical activity at least 20 minutes every single day. This means purposefully taking a walk, jog, bike, swim, treadmill, elliptical, dance, etc.  This activity should be ABOVE your normal daily activities, such as walking at work. Goal exercise should be at least 150 minutes a week- work your way up to this.   Heart Rate: Your maximum exercise heart rate should be 220 - Your Age in Years. When exercising, get your heart rate up, but avoid going over the maximum targeted heart rate.  60-70% of your maximum heart rate is where you tend to burn the most fat. To find this number:  220 - Age In Years= Max HR  Max HR x  0.6 (or 0.7) = Fat Burning HR The Fat Burning HR is your goal heart rate while working out to burn the most fat.  NEVER exercise to the point your feel lightheaded, weak, nauseated, dizzy. If you experience ANY of these symptoms- STOP exercise! Allow yourself to cool down and your heart rate to come down. Then restart slower next time.  If at ANY TIME you feel chest pain or chest pressure during exercise, STOP IMMEDIATELY and seek medical attention.

## 2023-04-06 NOTE — Assessment & Plan Note (Signed)
 Low progesterone  levels with concerns about HRT risks, particularly cardiovascular and cancer risks. Prefers natural alternatives or lower risk. Experiencing sleep issues and abdominal weight gain. Previously tolerated low-dose estrogen birth control without side effects and interested in resuming it rather than separate hormones at this time. She is still having periods.  - [facility] [employer]

## 2023-04-26 ENCOUNTER — Encounter: Payer: Self-pay | Admitting: Nurse Practitioner

## 2023-04-30 ENCOUNTER — Other Ambulatory Visit (HOSPITAL_COMMUNITY): Payer: Self-pay

## 2023-04-30 ENCOUNTER — Other Ambulatory Visit: Payer: Self-pay | Admitting: Nurse Practitioner

## 2023-04-30 ENCOUNTER — Telehealth: Payer: Self-pay

## 2023-04-30 DIAGNOSIS — E785 Hyperlipidemia, unspecified: Secondary | ICD-10-CM

## 2023-04-30 DIAGNOSIS — E669 Obesity, unspecified: Secondary | ICD-10-CM

## 2023-04-30 DIAGNOSIS — Z8632 Personal history of gestational diabetes: Secondary | ICD-10-CM | POA: Insufficient documentation

## 2023-04-30 DIAGNOSIS — R635 Abnormal weight gain: Secondary | ICD-10-CM

## 2023-04-30 HISTORY — DX: Personal history of gestational diabetes: Z86.32

## 2023-04-30 LAB — LAB REPORT - SCANNED: A1c: 4.7

## 2023-04-30 MED ORDER — ZEPBOUND 2.5 MG/0.5ML ~~LOC~~ SOAJ: 2.5000 mg | SUBCUTANEOUS | 0 refills | Status: DC

## 2023-04-30 NOTE — Telephone Encounter (Signed)
 Pharmacy Patient Advocate Encounter   Received notification from CoverMyMeds that prior authorization for Zepbound 2.5MG /0.5ML pen-injectors is required/requested.   Insurance verification completed.   The patient is insured through [facility] .   Per test claim: PA required; PA submitted to above mentioned insurance via CoverMyMeds Key/confirmation #/EOC (Key: [id number])     Status is pending

## 2023-05-02 ENCOUNTER — Other Ambulatory Visit: Payer: Self-pay | Admitting: Nurse Practitioner

## 2023-05-02 DIAGNOSIS — E669 Obesity, unspecified: Secondary | ICD-10-CM

## 2023-05-02 DIAGNOSIS — R635 Abnormal weight gain: Secondary | ICD-10-CM

## 2023-05-02 DIAGNOSIS — Z8632 Personal history of gestational diabetes: Secondary | ICD-10-CM

## 2023-05-02 DIAGNOSIS — E785 Hyperlipidemia, unspecified: Secondary | ICD-10-CM

## 2023-05-03 ENCOUNTER — Other Ambulatory Visit (HOSPITAL_COMMUNITY): Payer: Self-pay

## 2023-05-03 NOTE — Telephone Encounter (Signed)
 Pharmacy Patient Advocate Encounter  Received notification from [facility] that Prior Authorization for Zepbound has been DENIED.  Full denial letter will be uploaded to the media tab. See denial reason below.   Denial due to baseline BMI not being 30 or more or at 27 with specific details. Please see below

## 2023-05-07 ENCOUNTER — Telehealth: Payer: Self-pay | Admitting: Internal Medicine

## 2023-05-07 NOTE — Telephone Encounter (Signed)
 Copied from CRM [id number]. Topic: Clinical - Prescription Issue >> May 07, 2023  8:31 AM Evelyn Sellers wrote: Reason for CRM: pt calling to follow up on a weight loss med, (she has been discussing w/ [name] and sent several mychart messages). Pt would like a callback from [name] or [name] on Monday.  She is going to call her insurance and find out which weight loss meds is her preferred medication. Pt will most likely go on mychart to update any new information.

## 2023-05-12 MED ORDER — ZEPBOUND 2.5 MG/0.5ML ~~LOC~~ SOAJ: 2.5000 mg | SUBCUTANEOUS | 0 refills | Status: DC

## 2023-05-12 NOTE — Addendum Note (Signed)
 Addended by: Keontay Vora, [name] E on: 05/12/2023 12:40 PM   Modules accepted: Orders

## 2023-05-13 ENCOUNTER — Other Ambulatory Visit (HOSPITAL_COMMUNITY): Payer: Self-pay

## 2023-05-13 ENCOUNTER — Telehealth: Payer: Self-pay

## 2023-05-13 NOTE — Telephone Encounter (Signed)
 Pharmacy Patient Advocate Encounter   Received notification from Physician's Office that prior authorization for Zepbound 2.5MG /0.5ML pen-injectors is required/requested.   Insurance verification completed.   The patient is insured through [facility] .   Per test claim: PA required; PA submitted to above mentioned insurance via CoverMyMeds Key/confirmation #/EOC (Key: [id number])     Status is pending

## 2023-05-14 ENCOUNTER — Encounter: Payer: BC Managed Care – PPO | Admitting: Nurse Practitioner

## 2023-05-14 NOTE — Telephone Encounter (Signed)
 A new key had to be generated. Status is pending    (Key: [id number])

## 2023-05-17 ENCOUNTER — Other Ambulatory Visit (HOSPITAL_COMMUNITY): Payer: Self-pay

## 2023-05-17 NOTE — Telephone Encounter (Signed)
 Update:   I HAVE spoke with Insurance today and the case for the (Appeal) is still in review and a determination will be made by 3/19  Case id: [id number] Contact:[phone]

## 2023-05-18 ENCOUNTER — Telehealth: Payer: Self-pay | Admitting: Cardiovascular Disease

## 2023-05-18 NOTE — Telephone Encounter (Signed)
 Agree with recommendations from [facility], LPN. Is the metoprolol providing any relief? Does the arm pain change based on her sleeping position? Coronary CTA in 2017 revealed no coronary artery disease, however if she feels this is worsening with exertion or accompanied by chest pain or other concerning cardiac symptoms, would recommend follow-up with PCP or cardiology.

## 2023-05-18 NOTE — Telephone Encounter (Signed)
 Left message for patient to call back

## 2023-05-18 NOTE — Telephone Encounter (Signed)
 Called and left voice message for pt to call back. MyChart message sent for f/u as well.

## 2023-05-18 NOTE — Telephone Encounter (Signed)
 Called and spoke to patient. DOB verified.  Patient's concerns:  - for the past 2 nights, pt has been waking up with "tired" left arm and thinks she is having spasms while she asleep.  - states she had a stressful Saturday.  - on Monday, she took extra metoprolol and extra chewable aspirin.  - states she took half tablet of metoprolol last night.  - she does perform light work outs at gym.  - denies active CP.  - started ashwagandha for 5 days in coffee.  Nurse's Recommendations:  - will route to APP for recommendations.  - advise patient to monitor symptoms as she says this L arm feeling is "rare".  - informed pt that ashwagandha may cause drowsiness and L arm feeling could be a symptom of this.  - patient is due for a follow up appointment.  Patient verbalized understanding.

## 2023-05-18 NOTE — Telephone Encounter (Signed)
 pt is requesting cb to discuss Left arm pain/spasms x 2 days. She is taking extra metoprolol and baby aspirin with minimal relief. No other symptoms

## 2023-05-20 ENCOUNTER — Other Ambulatory Visit (HOSPITAL_COMMUNITY): Payer: Self-pay

## 2023-05-20 ENCOUNTER — Telehealth (HOSPITAL_BASED_OUTPATIENT_CLINIC_OR_DEPARTMENT_OTHER): Payer: Self-pay | Admitting: Family

## 2023-05-20 NOTE — Telephone Encounter (Signed)
 Called and spoke to patient. DOB verified.  Nurse's Recommendations:  - scheduled to see APP on 06/04/23 @ 8:25p to f/u with L arm spasms/pain.  Patient verbalized understanding.

## 2023-05-20 NOTE — Telephone Encounter (Signed)
   Pt c/o of Chest Pain: STAT if active CP, including tightness, pressure, jaw pain, radiating pain to shoulder/upper arm/back, CP unrelieved by Nitro. Symptoms reported of SOB, nausea, vomiting, sweating.  1. Are you having CP right now? Left arm pain at this time    2. Are you experiencing any other symptoms (ex. SOB, nausea, vomiting, sweating)? no   3. Is your CP continuous or coming and going? Stays   Have you taken Nitroglycerin? Never had any   5. How long have you been experiencing CP? Pain in left arm since Sunday    6. If NO CP at time of call then end call with telling Pt to call back or call 911 if Chest pain returns prior to return call from triage team.

## 2023-05-20 NOTE — Telephone Encounter (Signed)
 Final Update of Appeal :   Adam, I see that you have already made pt aware via MYCHART (I'm just doing an FYI) but the Ins plan denied due to the same exact reasons from the first attempt.

## 2023-05-24 ENCOUNTER — Ambulatory Visit: Payer: BC Managed Care – PPO | Admitting: Family Medicine

## 2023-05-24 ENCOUNTER — Encounter: Payer: Self-pay | Admitting: Family Medicine

## 2023-05-24 VITALS — BP 128/81 | HR 59 | Temp 97.9°F | Ht 64.0 in | Wt 150.4 lb

## 2023-05-24 DIAGNOSIS — Z1211 Encounter for screening for malignant neoplasm of colon: Secondary | ICD-10-CM

## 2023-05-24 DIAGNOSIS — E782 Mixed hyperlipidemia: Secondary | ICD-10-CM

## 2023-05-24 DIAGNOSIS — F900 Attention-deficit hyperactivity disorder, predominantly inattentive type: Secondary | ICD-10-CM

## 2023-05-24 DIAGNOSIS — Z3041 Encounter for surveillance of contraceptive pills: Secondary | ICD-10-CM | POA: Insufficient documentation

## 2023-05-24 DIAGNOSIS — Z1212 Encounter for screening for malignant neoplasm of rectum: Secondary | ICD-10-CM

## 2023-05-24 DIAGNOSIS — E663 Overweight: Secondary | ICD-10-CM

## 2023-05-24 DIAGNOSIS — G720 Drug-induced myopathy: Secondary | ICD-10-CM | POA: Insufficient documentation

## 2023-05-24 DIAGNOSIS — T466X5A Adverse effect of antihyperlipidemic and antiarteriosclerotic drugs, initial encounter: Secondary | ICD-10-CM

## 2023-05-24 DIAGNOSIS — N951 Menopausal and female climacteric states: Secondary | ICD-10-CM

## 2023-05-24 MED ORDER — AMPHETAMINE-DEXTROAMPHET ER 10 MG PO CP24: 10.0000 mg | ORAL_CAPSULE | Freq: Every day | ORAL | 0 refills | Status: DC

## 2023-05-24 MED ORDER — AMPHETAMINE-DEXTROAMPHET ER 10 MG PO CP24
10.0000 mg | ORAL_CAPSULE | Freq: Every day | ORAL | 0 refills | Status: DC
Start: 2023-07-23 — End: 2023-06-04

## 2023-05-24 NOTE — Progress Notes (Signed)
 Subjective  CC:  Chief Complaint  Patient presents with   Establish Care    Lt arm pain  Colonoscopy has not been schedled   Weight Loss    HPI: Evelyn Sellers is a 48 y.o. female who presents to the office today to address the problems listed above in the chief complaint. Discussed the use of AI scribe software for clinical note transcription with the patient, who gave verbal consent to proceed.  History of Present Illness   Evelyn Sellers is a 48 year old female who presents for a new patient visit. She was referred by Namon Cirri for a new patient visit.  She has a history of cardiac symptoms, including chest pain and arm pain, which began several years ago. Initially, she experienced chest pain while running, leading to a stress test that showed no abnormalities. She was placed on metoprolol approximately a year and a half ago due to arm pain and spasms, described as 'electric shock' sensations, often triggered by stress. There is a family history of cardiac issues, with her mother having had a stroke and cardiac catheterizations, and her grandfather having had multiple heart attacks (older). No neck pain or symptoms of neck arthritis. Arm pain occurs with stress and is not related to physical activity.  She has experienced significant weight gain over the past two years, increasing from a size six to a size ten. She attributes this to stress and possibly perimenopausal changes. She has tried semaglutide, which helped with hunger, but faces challenges with insurance coverage due to her BMI. There is a family history of diabetes and heart disease.  She has a history of high cholesterol, managed with Zetia, as she cannot tolerate statins. Her cholesterol levels have decreased by about thirty points with Zetia. She denies having high blood pressure but notes that her blood pressure was high during today's visit.  She experiences stress related to ongoing custody battles with  her ex-husband and is considering law school to better manage her legal situation. She has been prescribed Xanax for anxiety, which she uses sparingly due to its sedative effects. She also has a history of ADD, for which she occasionally takes medication like Ritalin or Adderall, especially when she needs to focus on tasks such as studying for the LSAT.  She recently resumed birth control to manage hormonal symptoms and avoid menstruation. She has no irregular cycles and her last Pap smear was normal.  She has not yet undergone colon cancer screening, despite a family history of colon cancer in her maternal grandmother.     The 10-year ASCVD risk score (Arnett DK, et al., 2019) is: 1.7% on zetia   Values used to calculate the score:     Age: 37 years     Sex: Female     Is Non-Hispanic African American: No     Diabetic: No     Tobacco smoker: No     Systolic Blood Pressure: 128 mmHg     Is BP treated: Yes     HDL Cholesterol: 56 mg/dL     Total Cholesterol: 233 mg/dL   Assessment  1. Attention deficit hyperactivity disorder (ADHD), predominantly inattentive type   2. Statin myopathy   3. Oral contraceptive use   4. Screening for colorectal cancer   5. Mixed hyperlipidemia   6. Perimenopause   7. Overweight      Plan  Assessment and Plan    Weight Gain, perimenopausal BMI 26.5 with family history of  diabetes and heart disease. Perimenopausal metabolic changes and stress contribute. Semaglutide effective for hunger control, insurance issues noted. - Prescribe semaglutide through compounding pharmacy if available. - Follow up in three months to assess weight loss progress.  Arm Pain Stress-related arm pain, likely not cardiac. Normal stress test 3-4 years ago. Metoprolol effective for symptoms, possible due to calming effects. - Continue metoprolol as prescribed. - Follow up with cardiologist in two weeks for further evaluation and treatment options.  Hyperlipidemia High  cholesterol managed with Zetia due to statin intolerance. Total cholesterol decreased by 30 points. Family history noted. - Review cholesterol levels with cardiologist. - Continue Zetia for cholesterol management - full lipoprotein panel ordered to better clarify CVD risk. By chart review, max LDL was 157.   Attention Deficit Disorder (ADD) ADD managed with medication, improves focus and task management. Discussed anxiety relationship. - rec daily treatment to help with ADD/ tasks, and possible manage anxiety related to ADD sxs - Adderall XR 10 daily to start.   OCP use: will clarify cardiology eval. If negative, ok with OCPs. Not for contraception and can help manage perimenopausal sxs.   General Health Maintenance Due for colonoscopy, family history of colon cancer. Recent Pap smear normal. - Refer to [facility] for colonoscopy.        Orders Placed This Encounter  Procedures   HM PAP SMEAR   Ambulatory referral to Gastroenterology   Meds ordered this encounter  Medications   amphetamine-dextroamphetamine (ADDERALL XR) 10 MG 24 hr capsule    Sig: Take 1 capsule (10 mg total) by mouth daily.    Dispense:  30 capsule    Refill:  0   amphetamine-dextroamphetamine (ADDERALL XR) 10 MG 24 hr capsule    Sig: Take 1 capsule (10 mg total) by mouth daily.    Dispense:  30 capsule    Refill:  0   amphetamine-dextroamphetamine (ADDERALL XR) 10 MG 24 hr capsule    Sig: Take 1 capsule (10 mg total) by mouth daily.    Dispense:  30 capsule    Refill:  0     I reviewed the patients updated PMH, FH, and SocHx.    Patient Active Problem List   Diagnosis Date Noted   Statin myopathy 05/24/2023    Priority: High   Oral contraceptive use 05/24/2023    Priority: High   Attention deficit hyperactivity disorder (ADHD), predominantly inattentive type 06/03/2021    Priority: High   Mixed hyperlipidemia 10/18/2014    Priority: High   Perimenopause 04/06/2023    Priority: Medium    Chronic  bilateral low back pain with left-sided sciatica 10/29/2021    Priority: Medium    Pulmonary nodules/lesions, multiple 10/27/2018    Priority: Medium    Osteoarthritis of right hip 06/09/2017    Priority: Medium    Genital herpes simplex 11/21/2021    Priority: Low   Bilateral plantar fasciitis 11/16/2018    Priority: Low   Family history of coronary artery disease in grandmother 09/06/2018   Current Meds  Medication Sig   AMBULATORY NON FORMULARY MEDICATION One mattress set for management of chronic bilateral low back pain with left sided sciatica and anterior cruciate ligament injury as covered by insurance.   amphetamine-dextroamphetamine (ADDERALL XR) 10 MG 24 hr capsule Take 1 capsule (10 mg total) by mouth daily.   [START ON 06/23/2023] amphetamine-dextroamphetamine (ADDERALL XR) 10 MG 24 hr capsule Take 1 capsule (10 mg total) by mouth daily.   [START ON 07/23/2023] amphetamine-dextroamphetamine (  ADDERALL XR) 10 MG 24 hr capsule Take 1 capsule (10 mg total) by mouth daily.   ezetimibe (ZETIA) 10 MG tablet Take 1 tablet (10 mg total) by mouth daily.   metoprolol succinate (TOPROL-XL) 25 MG 24 hr tablet Take 1 tablet (25 mg total) by mouth daily. Take with or immediately following a meal.   Norethindrone Acetate-Ethinyl Estrad-FE (LOESTRIN 24 FE) 1-20 MG-MCG(24) tablet Take 1 tablet by mouth daily. Skip placebo   [DISCONTINUED] methylphenidate (RITALIN) 20 MG tablet Take 0.5-1 tablets (10-20 mg total) by mouth 2 (two) times daily with breakfast and lunch.    Allergies: Patient is allergic to demerol [meperidine] and statins. Family History: Patient family history includes Diabetes in her father; Healthy in her son; Heart attack in her maternal grandfather and maternal grandmother; Hypertension in her maternal grandmother and mother; Stroke in her mother. Social History:  Patient  reports that she has never smoked. She has never used smokeless tobacco. She reports current alcohol use  of about 1.0 standard drink of alcohol per week. She reports that she does not use drugs.  Review of Systems: Constitutional: Negative for fever malaise or anorexia Cardiovascular: negative for chest pain Respiratory: negative for SOB or persistent cough Gastrointestinal: negative for abdominal pain  Objective  Vitals: BP 128/81   Pulse (!) 59   Temp 97.9 F (36.6 C)   Ht 5\' 4"  (1.626 m)   Wt 150 lb 6.4 oz (68.2 kg)   SpO2 99%   BMI 25.82 kg/m  General: no acute distress , A&Ox3 HEENT: PEERL, conjunctiva normal, neck is supple Cardiovascular:  RRR without murmur or gallop.  Respiratory:  Good breath sounds bilaterally, CTAB with normal respiratory effort Skin:  Warm, no rashes  Commons side effects, risks, benefits, and alternatives for medications and treatment plan prescribed today were discussed, and the patient expressed understanding of the given instructions. Patient is instructed to call or message via MyChart if he/she has any questions or concerns regarding our treatment plan. No barriers to understanding were identified. We discussed Red Flag symptoms and signs in detail. Patient expressed understanding regarding what to do in case of urgent or emergency type symptoms.  Medication list was reconciled, printed and provided to the patient in AVS. Patient instructions and summary information was reviewed with the patient as documented in the AVS. This note was prepared with assistance of Dragon voice recognition software. Occasional wrong-word or sound-a-like substitutions may have occurred due to the inherent limitations of voice recognition software

## 2023-05-24 NOTE — Patient Instructions (Addendum)
 Please return in 3 months for recheck weight and symptoms.  It was a pleasure meeting you today! Thank you for choosing [place] to meet your healthcare needs! I truly look forward to working with you. If you have any questions or concerns, please send me a message via Mychart or call the office at [phone].   VISIT SUMMARY:  Evelyn Sellers, a 48 year old female, visited today for a new patient consultation. She has a history of cardiac symptoms, weight gain, high cholesterol, stress-related arm pain, anxiety, and ADD. We discussed her current medications and family history of heart disease and diabetes. We also reviewed her recent health changes and lifestyle factors contributing to her symptoms.  YOUR PLAN:  -WEIGHT GAIN: Your BMI is 26.5, and you have a family history of diabetes and heart disease. The weight gain is likely due to perimenopausal changes and stress. We will prescribe semaglutide through a compounding pharmacy to help control hunger. Please follow up in three months to assess your progress.  -ARM PAIN: Your arm pain is stress-related and not due to heart issues, as confirmed by a normal stress test a few years ago. Metoprolol has been effective in managing your symptoms. Continue taking metoprolol as prescribed and follow up with a cardiologist in two weeks.  -HYPERLIPIDEMIA: Hyperlipidemia means you have high cholesterol levels. Your cholesterol has decreased by 30 points with Zetia, which you tolerate well. Continue taking Zetia and review your cholesterol levels with a cardiologist.  -ATTENTION DEFICIT DISORDER (ADD): ADD affects your ability to focus and manage tasks. Medication like Ritalin or Adderall helps improve these symptoms. I've prescribed Adderall XR 10mg  to be taken daily. Marland Kitchen  -GENERAL HEALTH MAINTENANCE: You are due for a colonoscopy, especially given your family history of colon cancer. Your recent Pap smear was normal. We will refer you to [facility] for a  colonoscopy.  INSTRUCTIONS:  Please follow up in three months to assess your weight loss progress. Continue taking metoprolol as prescribed and follow up with a cardiologist in two weeks. Review your cholesterol levels with a cardiologist. We will refer you to [facility] for a colonoscopy.

## 2023-05-25 ENCOUNTER — Encounter: Payer: Self-pay | Admitting: Obstetrics and Gynecology

## 2023-05-26 ENCOUNTER — Telehealth: Payer: Self-pay | Admitting: Family Medicine

## 2023-05-26 ENCOUNTER — Telehealth: Payer: Self-pay

## 2023-05-26 DIAGNOSIS — M25551 Pain in right hip: Secondary | ICD-10-CM | POA: Diagnosis not present

## 2023-05-26 NOTE — Telephone Encounter (Signed)
 Copied from CRM [id number]. Topic: Clinical - Medication Refill >> May 26, 2023 11:03 AM Marica Otter wrote: Most Recent Primary Care Visit:  Provider: Willow Ora  Department: [facility]  Visit Type: NEW PATIENT  Date: 05/24/2023  Medication: Semaglutide-Weight Management (WEGOVY) 0.25  Has the patient contacted their pharmacy? Yes, needs new prescription starting new medication (Agent: If no, request that the patient contact the pharmacy for the refill. If patient does not wish to contact the pharmacy document the reason why and proceed with request.) (Agent: If yes, when and what did the pharmacy advise?)  Is this the correct pharmacy for this prescription? Yes If no, delete pharmacy and type the correct one.  This is the patient's preferred pharmacy:   [facility] - [place], [place] - [address] Jerlyn Ly [address] [facility] [address] Jerlyn Ly [place] [address] Phone: [phone] Fax: [phone]   Has the prescription been filled recently? No  Is the patient out of the medication? Yes  Has the patient been seen for an appointment in the last year OR does the patient have an upcoming appointment? Yes  Can we respond through MyChart? Yes  Agent: Please be advised that Rx refills may take up to 3 business days. We ask that you follow-up with your pharmacy.

## 2023-05-26 NOTE — Telephone Encounter (Signed)
Form placed in providers box 

## 2023-05-26 NOTE — Telephone Encounter (Signed)
 Copied from CRM [id number]. Topic: General - Other >> May 25, 2023 10:50 AM Truddie Crumble wrote: Reason for CRM: patient called stating that there is a form that the pharmacy ([facility] [place]) sent to the doctor regarding the medication semaglutide that the doctor need to fill out and send back to them

## 2023-06-03 ENCOUNTER — Telehealth: Payer: Self-pay

## 2023-06-03 DIAGNOSIS — Z0279 Encounter for issue of other medical certificate: Secondary | ICD-10-CM

## 2023-06-03 NOTE — Telephone Encounter (Addendum)
 Spoke with pt to let her know her form was ready for pick up and will be placed up front at check in and pt also stated that you put her Adderall and it is not working. She works best on Ritalin and she usually takes one in the morning and one in the evenings  Please Advise

## 2023-06-04 ENCOUNTER — Other Ambulatory Visit (HOSPITAL_COMMUNITY): Payer: Self-pay

## 2023-06-04 ENCOUNTER — Telehealth: Payer: Self-pay | Admitting: Pharmacy Technician

## 2023-06-04 ENCOUNTER — Encounter (HOSPITAL_BASED_OUTPATIENT_CLINIC_OR_DEPARTMENT_OTHER): Payer: Self-pay | Admitting: Family

## 2023-06-04 ENCOUNTER — Telehealth (HOSPITAL_BASED_OUTPATIENT_CLINIC_OR_DEPARTMENT_OTHER): Payer: Self-pay

## 2023-06-04 ENCOUNTER — Ambulatory Visit (HOSPITAL_BASED_OUTPATIENT_CLINIC_OR_DEPARTMENT_OTHER): Admitting: Family

## 2023-06-04 VITALS — BP 110/70 | HR 58 | Ht 64.0 in | Wt 146.0 lb

## 2023-06-04 DIAGNOSIS — E785 Hyperlipidemia, unspecified: Secondary | ICD-10-CM

## 2023-06-04 DIAGNOSIS — R0789 Other chest pain: Secondary | ICD-10-CM

## 2023-06-04 DIAGNOSIS — I491 Atrial premature depolarization: Secondary | ICD-10-CM

## 2023-06-04 MED ORDER — METHYLPHENIDATE HCL 10 MG PO TABS: 10.0000 mg | ORAL_TABLET | Freq: Two times a day (BID) | ORAL | 0 refills | Status: DC

## 2023-06-04 MED ORDER — NEXLIZET 180-10 MG PO TABS
1.0000 | ORAL_TABLET | Freq: Every day | ORAL | Status: AC
Start: 2023-06-04 — End: ?

## 2023-06-04 MED ORDER — NEXLIZET 180-10 MG PO TABS: 1.0000 | ORAL_TABLET | Freq: Every day | ORAL | 3 refills | Status: AC

## 2023-06-04 NOTE — Telephone Encounter (Signed)
 Please PA nexlizet for pt

## 2023-06-04 NOTE — Addendum Note (Signed)
 Addended by: Asencion Partridge on: 06/04/2023 12:51 PM   Modules accepted: Orders

## 2023-06-04 NOTE — Telephone Encounter (Signed)
 Pharmacy Patient Advocate Encounter  Received notification from [facility] that Prior Authorization for nexlizet has been APPROVED from 06/04/23 to 06/03/24. Spoke to pharmacy to process.Copay is $75.00 for 90 days .    PA #/Case ID/Reference #: [id number]

## 2023-06-04 NOTE — Patient Instructions (Signed)
 Medication Instructions:  Your physician has recommended you make the following change in your medication:   Stop: Zetia  Start: nexlizet one tablet daily   You may take an extra half tablet of Metoprolol as needed for chest discomfort   *If you need a refill on your cardiac medications before your next appointment, please call your pharmacy*  Lab Work: Your physician recommends that you return for lab work in 3 months for Fasting Lipid & LFT    Follow-Up: At [facility], you and your health needs are our priority.  As part of our continuing mission to provide you with exceptional heart care, our providers are all part of one team.  This team includes your primary Cardiologist (physician) and Advanced Practice Providers or APPs (Physician Assistants and Nurse Practitioners) who all work together to provide you with the care you need, when you need it.  Your next appointment:   6 months with Dr. Duke Salvia, Gillian Shields, NP or Eligha Bridegroom, NP   Other Instructions WE have given you samples of Nexlizet today.

## 2023-06-04 NOTE — Progress Notes (Signed)
 Cardiology Office Note:  .   Date:  06/04/2023  ID:  Evelyn Sellers, DOB 26-Mar-1975, MRN [id number] PCP: Evelyn Ora, MD  [facility] Providers Cardiologist:  Chilton Si, MD    History of Present Illness: .   Evelyn Sellers is a 48 y.o. female  with a hx of hyperlipidemia, ADD, PAC. Family history of CAD in her grandmother and prinzmetal angina in her mother.    Prior ETT 03/2015 negative for ischemia. Due to persistent symptoms underwent CT-A 05/2016 with coronary calcium score of 0. Give hyperlipidemia previously started on Atorvastatin, Rosuvastatin but did not tolerate. She saw our pharmacy team 01/2020 and was started on Repatha.   Last seen 04/21/22. She had not started Repatha as was not interested in injections, Zetia initiated at that time. Metoprolol also added for sensation of electrical spasm across her chest radiating down her arm and EKG with PAC by EKG with concern for prinzmetal angina given her mother's medical history.    She presents today for cardiology follow up after contacting the office noting left arm pain. She has an 58 year old son and 47 year old stepson and lives a very busy and stressful life. She has been experenciing left arm pain  that she describes as a "spasm" for around a month and a half. She feels that the arm pain has changed as it has been occurring all day and is a more dull aching pain. She has never experienced sweating, SOB, or chest pain with her arm discomfort. She has been taking an extra half tablet of metoprolol at times when the pain gets worse. She is frustrated with her weight gain related to pre menopause and is starting on Wegovy. Reports no shortness of breath nor dyspnea on exertion. Reports no chest pain, pressure, or tightness. No edema, orthopnea, PND. Reports no palpitations.   Labs 03/2023 total cholesterol 211, triglycerides 63, HDL 76, LDL 124.  Creatinine 0.83, Na 139, K 4.6, AST 16, ALT 14 * Hb 14.4, hct 43.7,  Plt 269 TSH 2.51, T4 6.3, T3 27 A1c 4.7 Cortisol 10.1 (normal)   Previous lipid agents Atorvastatin - myalgias Rosuvastatin - myalgias Red Yeast rice - myalgias  ROS: Please see the history of present illness.    All other systems reviewed and are negative.   Studies Reviewed: Marland Kitchen   EKG Interpretation Date/Time:  Friday June 04 2023 08:40:36 EDT Ventricular Rate:  58 PR Interval:  178 QRS Duration:  76 QT Interval:  426 QTC Calculation: 418 R Axis:   77  Text Interpretation: Sinus bradycardia No acute changes Confirmed by Evelyn Sellers ([id number]) on 06/04/2023 8:48:54 AM    Cardiac Studies & Procedures   ______________________________________________________________________________________________   STRESS TESTS  EXERCISE TOLERANCE TEST (ETT) 03/27/2015  Narrative  Blood pressure demonstrated a hypertensive response to exercise.  There was no ST segment deviation noted during stress.  No T wave inversion was noted during stress.  Normal ECG treadmill stress test. Elevated diastolic BP at rest and with exercise.        CT SCANS  CT CORONARY MORPH W/CTA COR W/SCORE 06/26/2015  Addendum 06/26/2015 12:50 PM ADDENDUM REPORT: 06/26/2015 12:48 CLINICAL DATA:  Chest pain EXAM: Cardiac CTA MEDICATIONS: Sub lingual nitro. 4mg  and lopressor 2.5mg  IV TECHNIQUE: The patient was scanned on a Philips 256 slice scanner. Gantry rotation speed was 270 msecs. Collimation was .9mm. A 100 kV prospective scan was triggered in the descending thoracic aorta at 111 HU's with 5%  padding centered around 78% of the R-R interval. Average HR during the scan was 60 bpm. The 3D data set was interpreted on a dedicated work station using MPR, MIP and VRT modes. A total of 80cc of contrast was used. FINDINGS: Non-cardiac: See separate report from [facility]. Calcium Score:  0 Agatston units. Coronary Arteries: Right dominant with no anomalies LM:  No plaque or stenosis. LAD  system:  No plaque or stenosis. Circumflex system: There was a small ramus and a moderate PLOM. No plaque or stenosis. RCA:  No plaque or stenosis. IMPRESSION: 1. Coronary artery calcium score of 0 Agatston units suggests low risk of future cardiac events. 2.  No coronary disease noted on this scan. Dalton Mclean Electronically Signed By: Evelyn Sellers M.D. On: 06/26/2015 12:48  Narrative EXAM: OVER-READ INTERPRETATION  CT CHEST  The following report is an over-read performed by radiologist Dr. Royal Sellers [facility] on 06/26/2015. This over-read does not include interpretation of cardiac or coronary anatomy or pathology. The coronary calcium score/coronary CTA interpretation by the cardiologist is attached.  COMPARISON:  None.  FINDINGS: Several tiny pulmonary nodules scattered throughout the visualized lungs all measuring 4 mm or less in size. Within the visualized portions of the thorax there are no other larger more suspicious appearing pulmonary nodules or masses, there is no acute consolidative airspace disease, no pleural effusions, no pneumothorax and no lymphadenopathy. Sub cm low-attenuation lesions in the liver are too small to definitively characterize, but are statistically likely to represent cysts. There are no aggressive appearing lytic or blastic lesions noted in the visualized portions of the skeleton.  IMPRESSION: 1. No acute incidental noncardiac findings noted. 2. Several tiny pulmonary nodules in the visualize lungs measuring 4 mm or less in size, highly nonspecific but statistically likely benign in this young patient. No follow-up needed if patient is low-risk (and has no known or suspected primary neoplasm). Non-contrast chest CT can be considered in 12 months if patient is high-risk. This recommendation follows the consensus statement: Guidelines for Management of Incidental Pulmonary Nodules Detected on CT Images:From the  Fleischner Society 2017; published online before print (10.1148/radiol.[id number]).  Electronically Signed: By: Trudie Reed M.D. On: 06/26/2015 12:42     ______________________________________________________________________________________________      Risk Assessment/Calculations:                 Physical Exam:   VS:  BP 110/70 (BP Location: Right Arm)   Pulse (!) 58   Ht 5\' 4"  (1.626 m)   Wt 146 lb (66.2 kg)   SpO2 99%   BMI 25.06 kg/m    Wt Readings from Last 3 Encounters:  06/04/23 146 lb (66.2 kg)  05/24/23 150 lb 6.4 oz (68.2 kg)  04/06/23 146 lb 6.4 oz (66.4 kg)    GEN: Well nourished, well developed in no acute distress NECK: No JVD; No carotid bruits CARDIAC: RRR, no murmurs, rubs, gallops RESPIRATORY:  Clear to auscultation without rales, wheezing or rhonchi  ABDOMEN: Soft, non-tender, non-distended EXTREMITIES:  No edema; No deformity   ASSESSMENT AND PLAN: .    HLD - Labs 03/2023 total cholesterol 211, triglycerides 63, HDL 76, LDL 124. Has had high LDL since she can remember. Stop Zetia. Start Nexlizet 180-10 mg. Repeat lipid panel and LFT in 3 months. She has previously stated she prefers no injectable medications. Recommend aiming for 150 minutes of moderate intensity activity per week and following a heart healthy diet.    Chest pain in  adult / PAC- Has had heavy and dull arm pain the last month and a half. Continue metoprolol 25 mg daily. Can take extra 1/2 tablet as needed with increase arm heaviness. Suspect musculoskeletal etiology however with family history of microvascular angina cannot exclude.   EKG today normal sinus. Can consider adding low dose amlodipine or cardiac PET scan in the future is symptoms worsen. Will reach out to billing to inquire about cost estimate for PET. Symptoms atypical for angina, cardiac CTA calcium score of 0, reassurance provided.        Dispo: Follow up in 6 months.   Signed, Alver Sorrow, NP

## 2023-06-04 NOTE — Telephone Encounter (Signed)
 Pharmacy Patient Advocate Encounter   Received notification from Pt Calls Messages that prior authorization for nexlizet is required/requested.   Insurance verification completed.   The patient is insured through [facility] .   Per test claim: PA required; PA submitted to above mentioned insurance via CoverMyMeds Key/confirmation #/EOC [id number] Status is pending

## 2023-06-07 ENCOUNTER — Encounter (HOSPITAL_BASED_OUTPATIENT_CLINIC_OR_DEPARTMENT_OTHER): Payer: Self-pay

## 2023-06-07 DIAGNOSIS — R0789 Other chest pain: Secondary | ICD-10-CM

## 2023-06-07 DIAGNOSIS — I491 Atrial premature depolarization: Secondary | ICD-10-CM

## 2023-06-25 ENCOUNTER — Other Ambulatory Visit: Payer: Self-pay | Admitting: Family Medicine

## 2023-06-25 NOTE — Telephone Encounter (Signed)
 Copied from CRM [id number]. Topic: Clinical - Medication Refill >> May 26, 2023 11:03 AM Varney Gentleman wrote: Most Recent Primary Care Visit:  Provider: Luevenia Saha  Department: [facility]  Visit Type: NEW PATIENT  Date: 05/24/2023  Medication: Semaglutide -Weight Management (WEGOVY ) 0.25  Has the patient contacted their pharmacy? Yes, needs new prescription starting new medication (Agent: If no, request that the patient contact the pharmacy for the refill. If patient does not wish to contact the pharmacy document the reason why and proceed with request.) (Agent: If yes, when and what did the pharmacy advise?)  Is this the correct pharmacy for this prescription? Yes If no, delete pharmacy and type the correct one.  This is the patient's preferred pharmacy:   [facility] - [place], [place] - [address] Reva Pinkley [place] [address] [facility] [address] Lanitra Battaglini [place] [address] Phone: [phone] Fax: [phone]   Has the prescription been filled recently? No  Is the patient out of the medication? Yes  Has the patient been seen for an appointment in the last year OR does the patient have an upcoming appointment? Yes  Can we respond through MyChart? Yes  Agent: Please be advised that Rx refills may take up to 3 business days. We ask that you follow-up with your pharmacy. >> Jun 25, 2023  1:47 PM Armenia J wrote: Patient calling in following up on the request for her semaglutide  replacement. Patient accidentally threw medication away and is needed a new order sent in to:   [facility] - [place], [place] - [address] [facility] [address] Rutherford Alarie [place]  [address] [facility] [address] Parsa Rickett [place] [address]  Phone: [phone] Fax: [phone]  Hours: Not open 24 hours

## 2023-06-28 ENCOUNTER — Other Ambulatory Visit: Payer: Self-pay | Admitting: Family Medicine

## 2023-06-28 NOTE — Telephone Encounter (Signed)
 Please advise on refill, previously filled by historical provider

## 2023-06-28 NOTE — Telephone Encounter (Signed)
 FYI

## 2023-06-28 NOTE — Telephone Encounter (Signed)
 Sent patient message. Will not be prescribing compounded formuations of glp-1.  See mychart message.

## 2023-07-31 ENCOUNTER — Encounter: Payer: Self-pay | Admitting: Family Medicine

## 2023-08-02 ENCOUNTER — Telehealth: Payer: Self-pay

## 2023-08-02 ENCOUNTER — Other Ambulatory Visit: Payer: Self-pay | Admitting: Family Medicine

## 2023-08-02 MED ORDER — METHYLPHENIDATE HCL 10 MG PO TABS: 10.0000 mg | ORAL_TABLET | Freq: Two times a day (BID) | ORAL | 0 refills | Status: DC

## 2023-08-02 NOTE — Telephone Encounter (Signed)
 Per chart review this medication was refilled today 13:55.  Informed the patient appropriately.   No additional questions/concerns noted during the time of the call.    Copied from CRM [id number]. Topic: Clinical - Prescription Issue >> Aug 02, 2023  3:20 PM Alyse July wrote: Reason for CRM: Patient informed of the requirements to have medication refill(methylphenidate  (RITALIN ) 10 MG tablet ) and stated multiple time she doesn't  understand why she has to come in for an appointment.. Patient is totally out of medication and has testing scheduled for Wednesday and Friday all day and will be unable to attend an appointment. Patient stated the provider is aware of the upcoming test. [phone].

## 2023-08-02 NOTE — Telephone Encounter (Signed)
 Copied from CRM [id number]. Topic: Clinical - Medication Refill >> Aug 02, 2023 12:25 PM Evelyn Sellers wrote: Medication: methylphenidate  (RITALIN ) 10 MG tablet  Has the patient contacted their pharmacy? Yes (Agent: If no, request that the patient contact the pharmacy for the refill. If patient does not wish to contact the pharmacy document the reason why and proceed with request.) (Agent: If yes, when and what did the pharmacy advise?) - to call dr. Clare Critchley   This is the patient's preferred pharmacy:  [id number] [name], [place] - [address] [place] [address] Phone: [phone] Fax: [phone]  Is this the correct pharmacy for this prescription? Yes If no, delete pharmacy and type the correct one.   Has the prescription been filled recently? Yes  Is the patient out of the medication? Yes  Has the patient been seen for an appointment in the last year OR does the patient have an upcoming appointment? Yes  Can we respond through MyChart? Yes  Agent: Please be advised that Rx refills may take up to 3 business days. We ask that you follow-up with your pharmacy.

## 2023-08-02 NOTE — Telephone Encounter (Signed)
 Incoming call from patient. Patient informed of the requirements to have medication refill(methylphenidate  (RITALIN ) 10 MG tablet) and stated multiple time she doesn't  understand why she has to come in for an appointment. Declined available appointment offered on Wednesday and Friday.  Patient is out of medication and has testing scheduled for Wednesday and Friday all day and will be unable to attend an appointment. Patient stated the provider is aware of the upcoming test. Please contact patient to advise [phone].

## 2023-08-02 NOTE — Telephone Encounter (Signed)
 Copied from CRM [id number]. Topic: Clinical - Medication Question >> Aug 02, 2023 12:29 PM Evelyn Sellers wrote: Reason for CRM: Patient called in to request refill on  methylphenidate  (RITALIN ) 10 MG tablet, would like to know if request could be expedited due to her needing medication by tomorrow. Please call [phone] to confirm  Message has been sent to Dr. Waldo Guitar to address

## 2023-08-03 MED ORDER — AMPHETAMINE-DEXTROAMPHET ER 10 MG PO CP24: 10.0000 mg | ORAL_CAPSULE | Freq: Every day | ORAL | 0 refills | Status: DC

## 2023-08-03 NOTE — Telephone Encounter (Signed)
 RX refill sent by MD in office; noted no more refills on medication unless seen in office by PCP; nothing further needed at this time

## 2023-08-09 ENCOUNTER — Encounter: Payer: Self-pay | Admitting: Family Medicine

## 2023-08-09 ENCOUNTER — Encounter (HOSPITAL_COMMUNITY): Payer: Self-pay

## 2023-08-09 ENCOUNTER — Encounter: Payer: Self-pay | Admitting: Internal Medicine

## 2023-08-09 ENCOUNTER — Telehealth (INDEPENDENT_AMBULATORY_CARE_PROVIDER_SITE_OTHER): Admitting: Family Medicine

## 2023-08-09 VITALS — Ht 64.0 in | Wt 145.0 lb

## 2023-08-09 DIAGNOSIS — F4322 Adjustment disorder with anxiety: Secondary | ICD-10-CM | POA: Insufficient documentation

## 2023-08-09 DIAGNOSIS — R079 Chest pain, unspecified: Secondary | ICD-10-CM

## 2023-08-09 DIAGNOSIS — F900 Attention-deficit hyperactivity disorder, predominantly inattentive type: Secondary | ICD-10-CM

## 2023-08-09 DIAGNOSIS — H1089 Other conjunctivitis: Secondary | ICD-10-CM

## 2023-08-09 DIAGNOSIS — Y7711 Contact lens associated with adverse incidents: Secondary | ICD-10-CM

## 2023-08-09 MED ORDER — AMPHETAMINE-DEXTROAMPHETAMINE 10 MG PO TABS: 10.0000 mg | ORAL_TABLET | Freq: Every day | ORAL | 0 refills | Status: DC

## 2023-08-09 MED ORDER — ERYTHROMYCIN 5 MG/GM OP OINT: 1.0000 | TOPICAL_OINTMENT | Freq: Three times a day (TID) | OPHTHALMIC | 0 refills | Status: DC
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
## 2023-08-10 ENCOUNTER — Other Ambulatory Visit: Payer: Self-pay

## 2023-08-10 DIAGNOSIS — Z1211 Encounter for screening for malignant neoplasm of colon: Secondary | ICD-10-CM

## 2023-08-10 NOTE — Telephone Encounter (Signed)
 Card mailed

## 2023-08-10 NOTE — Telephone Encounter (Signed)
 Please review and advise.

## 2023-08-11 ENCOUNTER — Other Ambulatory Visit: Payer: Self-pay | Admitting: Family

## 2023-08-11 ENCOUNTER — Ambulatory Visit (HOSPITAL_COMMUNITY): Admission: RE | Admit: 2023-08-11 | Source: Ambulatory Visit

## 2023-08-11 DIAGNOSIS — Z1211 Encounter for screening for malignant neoplasm of colon: Secondary | ICD-10-CM

## 2023-08-16 DIAGNOSIS — Z1211 Encounter for screening for malignant neoplasm of colon: Secondary | ICD-10-CM | POA: Diagnosis not present

## 2023-08-22 LAB — COLOGUARD: COLOGUARD: NEGATIVE

## 2023-08-24 ENCOUNTER — Ambulatory Visit: Payer: Self-pay | Admitting: Family

## 2023-09-01 ENCOUNTER — Encounter

## 2023-09-02 ENCOUNTER — Encounter

## 2023-09-19 ENCOUNTER — Encounter: Payer: Self-pay | Admitting: Nurse Practitioner

## 2023-09-22 ENCOUNTER — Telehealth: Payer: Self-pay | Admitting: Nurse Practitioner

## 2023-09-22 NOTE — Telephone Encounter (Signed)
 Spoke to pt she has sent My Chart message to Camie but PCP has been changed in Epic and she has been seen by that provider. Per patient she had some paperwork for school that she needed and could not get in with [facility]ie. She did not think there would be any issue since Camie NP and Dr Jodie I MD, advised her that she can not have 2 PCP's . Patient states she stay with Camie , she has upcoming physical on 10/04/23

## 2023-09-28 ENCOUNTER — Encounter: Admitting: Internal Medicine

## 2023-10-01 NOTE — Progress Notes (Signed)
 T-dap: Colonoscopy:   Catheline Oris CHOL, AGNP-c [facility] [address] [place], [place] [address] Main Office [phone]  BP 124/82   Pulse 88   Ht 5' 4 (1.626 m)   Wt 143 lb (64.9 kg)   LMP  (LMP Unknown)   BMI 24.55 kg/m    Subjective:    Patient ID: Evelyn Sellers, female    DOB: Feb 10, 1976, 48 y.o.   MRN: [id number]  HPI: History of Present Illness Evelyn Sellers is a 48 year old female who presents for an annual physical exam and to discuss stressors related to child custody and ADHD medication concerns.  She is experiencing significant stress due to ongoing child custody issues with her ex-husband. Her 84 year old son has been refusing to visit her since Reign Dziuba May, following an incident where her husband insisted he attend church. She describes the situation as 'devastating' and has been managing her stress through counseling and support from her current husband. She is starting law school in the fall, which adds to her stress.  She has a history of ADHD and was previously on Adderall, which she suspects caused arm pain. She switched back to Ritalin , which she had used before, and reports it is working well without side effects. She also takes metoprolol  25 mg once daily at night for arm pain.  She has been exploring options for weight management, including semaglutide , but has faced challenges with insurance coverage and delivery issues. She has tried Noom for weight management but encountered problems with medication delivery. She is also on birth control, which she finds effective in managing her menstrual cycle without additional progesterone  supplementation. She reports occasional hot flashes at night and weight gain but no significant breakthrough bleeding since May.  No issues with sleep, although she wakes up Elveta Rape, which she attributes to her routine and upcoming law school schedule. She occasionally uses melatonin to aid sleep. No  significant changes in appetite or bowel habits, although she notes some changes due to medication. No swelling in her feet or ankles, no changes in hearing or vision, and no fullness in her throat or difficulty swallowing. Arm pain has resolved since stopping Adderall.   Pertinent items are noted in HPI.   Most Recent Depression Screen:     10/04/2023    8:47 AM 08/09/2023   11:35 AM 05/24/2023    1:56 PM 09/16/2021    2:15 PM 05/28/2021   10:35 AM  Depression screen PHQ 2/9  Decreased Interest 0 0 0 0 0  Down, Depressed, Hopeless 0 0 0 0 0  PHQ - 2 Score 0 0 0 0 0  Altered sleeping    0   Tired, decreased energy    0   Change in appetite    0   Feeling bad or failure about yourself     0   Trouble concentrating    0   Moving slowly or fidgety/restless    0   Suicidal thoughts    0   PHQ-9 Score    0   Difficult doing work/chores    Not difficult at all    Most Recent Anxiety Screen:     08/09/2023   11:35 AM 05/24/2023    1:56 PM  GAD 7 : Generalized Anxiety Score  Nervous, Anxious, on Edge 0 0  Control/stop worrying 0 0  Worry too much - different things 0 0  Trouble relaxing 0 0  Restless 0 0  Easily annoyed or irritable  0 0  Afraid - awful might happen 0 0  Total GAD 7 Score 0 0  Anxiety Difficulty Not difficult at all Not difficult at all   Most Recent Fall Screen:    10/04/2023    8:47 AM 08/09/2023   11:35 AM 05/24/2023    1:48 PM 09/16/2021    2:14 PM 05/28/2021   10:34 AM  Fall Risk   Falls in the past year? 0 0 0 0 1  Number falls in past yr: 0 0 0 0 0  Injury with Fall? 0 0 0 0 1  Risk for fall due to : No Fall Risks No Fall Risks No Fall Risks No Fall Risks Other (Comment)  Follow up Falls evaluation completed Falls evaluation completed Falls evaluation completed Falls evaluation completed  Falls evaluation completed;Education provided      Data saved with a previous flowsheet row definition    Past medical history, surgical history, medications, allergies,  family history and social history reviewed with patient today and changes made to appropriate areas of the chart.  Past Medical History:  Past Medical History:  Diagnosis Date   ADD (attention deficit disorder)    Arthritis    Hyperlipemia    Low grade squamous intraepithelial lesion (LGSIL) on cervicovaginal cytologic smear 05/30/2012   COLPO = LGSIL, CRYO 08/2012     Personal history of gestational diabetes 04/30/2023   Medications:  Current Outpatient Medications on File Prior to Visit  Medication Sig   Aspirin 81 MG CAPS daily at 6 (six) AM.   Azelastine HCl 137 MCG/SPRAY SOLN as needed (allergies).   Bempedoic Acid -Ezetimibe  (NEXLIZET ) 180-10 MG TABS Take 1 tablet by mouth daily.   Bempedoic Acid -Ezetimibe  (NEXLIZET ) 180-10 MG TABS Take 1 tablet by mouth daily.   cetirizine (ZYRTEC) 10 MG tablet Take by mouth as needed for allergies or rhinitis.   erythromycin  ophthalmic ointment Place 1 Application into both eyes 3 (three) times daily.   ipratropium (ATROVENT) 0.06 % nasal spray as needed for rhinitis.   metoprolol  succinate (TOPROL -XL) 25 MG 24 hr tablet Take 1 tablet (25 mg total) by mouth daily. Take with or immediately following a meal.   montelukast (SINGULAIR) 10 MG tablet Take by mouth as needed (allergies).   Norethindrone Acetate-Ethinyl Estrad-FE (LOESTRIN 24 FE) 1-20 MG-MCG(24) tablet Take 1 tablet by mouth daily. Skip placebo   No current facility-administered medications on file prior to visit.   Surgical History:  Past Surgical History:  Procedure Laterality Date   ANTERIOR CRUCIATE LIGAMENT REPAIR     Allergies:  Allergies  Allergen Reactions   Demerol [Meperidine] Nausea Only   Statins Other (See Comments)    Myalgia   Family History:  Family History  Problem Relation Age of Onset   Hypertension Mother    Stroke Mother    Diabetes Father    Healthy Son    Hypertension Maternal Grandmother    Heart attack Maternal Grandmother    Heart attack  Maternal Grandfather        4X       Objective:    BP 124/82   Pulse 88   Ht 5' 4 (1.626 m)   Wt 143 lb (64.9 kg)   LMP  (LMP Unknown)   BMI 24.55 kg/m   Wt Readings from Last 3 Encounters:  10/04/23 143 lb (64.9 kg)  08/09/23 145 lb (65.8 kg)  06/04/23 146 lb (66.2 kg)    Physical Exam Vitals and nursing note reviewed.  Constitutional:  General: She is not in acute distress.    Appearance: Normal appearance.  HENT:     Head: Normocephalic and atraumatic.     Right Ear: Hearing, tympanic membrane, ear canal and external ear normal.     Left Ear: Hearing, tympanic membrane, ear canal and external ear normal.     Nose: Nose normal.     Right Sinus: No maxillary sinus tenderness or frontal sinus tenderness.     Left Sinus: No maxillary sinus tenderness or frontal sinus tenderness.     Mouth/Throat:     Lips: Pink.     Mouth: Mucous membranes are moist.     Pharynx: Oropharynx is clear.  Eyes:     General: Lids are normal. Vision grossly intact.     Extraocular Movements: Extraocular movements intact.     Conjunctiva/sclera: Conjunctivae normal.     Pupils: Pupils are equal, round, and reactive to light.     Funduscopic exam:    Right eye: Red reflex present.        Left eye: Red reflex present.    Visual Fields: Right eye visual fields normal and left eye visual fields normal.  Neck:     Thyroid : No thyromegaly.     Vascular: No carotid bruit.  Cardiovascular:     Rate and Rhythm: Normal rate and regular rhythm.     Chest Wall: PMI is not displaced.     Pulses: Normal pulses.          Dorsalis pedis pulses are 2+ on the right side and 2+ on the left side.       Posterior tibial pulses are 2+ on the right side and 2+ on the left side.     Heart sounds: Normal heart sounds. No murmur heard. Pulmonary:     Effort: Pulmonary effort is normal. No respiratory distress.     Breath sounds: Normal breath sounds.  Abdominal:     General: Abdomen is flat. Bowel sounds  are normal. There is no distension.     Palpations: Abdomen is soft. There is no hepatomegaly, splenomegaly or mass.     Tenderness: There is no abdominal tenderness. There is no right CVA tenderness, left CVA tenderness, guarding or rebound.  Musculoskeletal:        General: Normal range of motion.     Cervical back: Full passive range of motion without pain, normal range of motion and neck supple. No tenderness.     Right lower leg: No edema.     Left lower leg: No edema.  Feet:     Left foot:     Toenail Condition: Left toenails are normal.  Lymphadenopathy:     Cervical: No cervical adenopathy.     Upper Body:     Right upper body: No supraclavicular adenopathy.     Left upper body: No supraclavicular adenopathy.  Skin:    General: Skin is warm and dry.     Capillary Refill: Capillary refill takes less than 2 seconds.     Nails: There is no clubbing.  Neurological:     General: No focal deficit present.     Mental Status: She is alert and oriented to person, place, and time.     GCS: GCS eye subscore is 4. GCS verbal subscore is 5. GCS motor subscore is 6.     Sensory: Sensation is intact.     Motor: Motor function is intact.     Coordination: Coordination is intact.  Gait: Gait is intact.     Deep Tendon Reflexes: Reflexes are normal and symmetric.  Psychiatric:        Attention and Perception: Attention normal.        Mood and Affect: Mood normal.        Speech: Speech normal.        Behavior: Behavior normal. Behavior is cooperative.        Thought Content: Thought content normal.        Cognition and Memory: Cognition and memory normal.        Judgment: Judgment normal.      Results for orders placed or performed in visit on 08/11/23  Cologuard   Collection Time: 08/16/23  8:15 AM  Result Value Ref Range   COLOGUARD Negative Negative       Assessment & Plan:   Problem List Items Addressed This Visit     Attention deficit hyperactivity disorder (ADHD),  predominantly inattentive type   Currently managed with Ritalin , which is effective. Previous arm pain potentially linked to Adderall use has resolved since switching medications. No issues with sleep or appetite reported. - Prescribe Ritalin  for twice daily use. - 3 months of prescriptions sent to pharmacy.  - F/U in 6 months with virtual visit to monitor control as required by law.       Relevant Medications   methylphenidate  (RITALIN ) 10 MG tablet   methylphenidate  (RITALIN ) 10 MG tablet (Start on 11/01/2023)   methylphenidate  (RITALIN ) 10 MG tablet (Start on 11/29/2023)   Chronic bilateral low back pain with left-sided sciatica   Ongoing lower back pain, possibly related to bed comfort. Previous prescription for bed support not fulfilled. - Renew prescription for bed support.       Relevant Medications   methylphenidate  (RITALIN ) 10 MG tablet   methylphenidate  (RITALIN ) 10 MG tablet (Start on 11/01/2023)   methylphenidate  (RITALIN ) 10 MG tablet (Start on 11/29/2023)   AMBULATORY NON FORMULARY MEDICATION   Perimenopause   Currently using norethindrone/ethinyl estradiol  for contraception. Occasional hot flashes noted, but no breakthrough bleeding since May. - Refill prescription for norethindrone/ethinyl estradiol . - Monitor for any changes in symptoms or side effects.      Pure hypercholesterolemia   - Refill prescription for ezetimibe . Labs to be completed by GYN      Encounter for annual physical exam - Primary   CPE completed today. Review of HM activities and recommendations discussed and provided on AVS. Anticipatory guidance, diet, and exercise recommendations provided. Medications, allergies, and hx reviewed and updated as necessary. Orders placed as listed below.  Plan: - Labs to be ordered with GYN visit- not fasting this morning. Will make changes as necessary based on results once received.  - I will review these results and send recommendations via MyChart or a telephone  call.  - F/U with CPE in 1 year or sooner for acute/chronic health needs as directed.        Situational stress   Significant stress due to ongoing child custody dispute with ex-husband. Emotional distress present, but no clinical depression diagnosed. Engaged in counseling and utilizing coping strategies. Discussed the therapeutic potential of writing letters to her son as a form of emotional expression. - Encourage writing letters to son as a therapeutic exercise.      Precordial pain   Managed with metoprolol  25 mg once daily. Cardiologist recommended increasing metoprolol  dose previously due to arm pain, which has resolved. - Refill prescription for metoprolol  25 mg once daily.  Mixed hyperlipidemia   Other Visit Diagnoses       Status post repair of anterior cruciate ligament       Relevant Medications   AMBULATORY NON FORMULARY MEDICATION     Need for tetanus booster             Follow up plan: Return in about 6 months (around 04/05/2024) for Med Management 30_Virtual ADHD f/u.  NEXT PREVENTATIVE PHYSICAL DUE IN 1 YEAR.  PATIENT COUNSELING PROVIDED FOR ALL ADULT PATIENTS: A well balanced diet low in saturated fats, cholesterol, and moderation in carbohydrates.  This can be as simple as monitoring portion sizes and cutting back on sugary beverages such as soda and juice to start with.    Daily water consumption of at least 64 ounces.  Physical activity at least 180 minutes per week.  If just starting out, start 10 minutes a day and work your way up.   This can be as simple as taking the stairs instead of the elevator and walking 2-3 laps around the office  purposefully every day.   STD protection, partner selection, and regular testing if high risk.  Limited consumption of alcoholic beverages if alcohol is consumed. For men, I recommend no more than 14 alcoholic beverages per week, spread out throughout the week (max 2 per day). Avoid binge drinking or  consuming large quantities of alcohol in one setting.  Please let me know if you feel you may need help with reduction or quitting alcohol consumption.   Avoidance of nicotine, if used. Please let me know if you feel you may need help with reduction or quitting nicotine use.   Daily mental health attention. This can be in the form of 5 minute daily meditation, prayer, journaling, yoga, reflection, etc.  Purposeful attention to your emotions and mental state can significantly improve your overall wellbeing  and  Health.  Please know that I am here to help you with all of your health care goals and am happy to work with you to find a solution that works best for you.  The greatest advice I have received with any changes in life are to take it one step at a time, that even means if all you can focus on is the next 60 seconds, then do that and celebrate your victories.  With any changes in life, you will have set backs, and that is OK. The important thing to remember is, if you have a set back, it is not a failure, it is an opportunity to try again! Screening Testing Mammogram Every 1 -2 years based on history and risk factors Starting at age 59 Pap Smear Ages 21-39 every 3 years Ages 31-65 every 5 years with HPV testing More frequent testing may be required based on results and history Colon Cancer Screening Every 1-10 years based on test performed, risk factors, and history Starting at age 63 Bone Density Screening Every 2-10 years based on history Starting at age 14 for women Recommendations for men differ based on medication usage, history, and risk factors AAA Screening One time ultrasound Men 24-58 years old who have every smoked Lung Cancer Screening Low Dose Lung CT every 12 months Age 64-80 years with a 30 pack-year smoking history who still smoke or who have quit within the last 15 years   Screening Labs Routine  Labs: Complete Blood Count (CBC), Complete Metabolic Panel  (CMP), Cholesterol (Lipid Panel) Every 6-12 months based on history and medications May be recommended more  frequently based on current conditions or previous results Hemoglobin A1c Lab Every 3-12 months based on history and previous results Starting at age 29 or earlier with diagnosis of diabetes, high cholesterol, BMI >26, and/or risk factors Frequent monitoring for patients with diabetes to ensure blood sugar control Thyroid  Panel (TSH) Every 6 months based on history, symptoms, and risk factors May be repeated more often if on medication HIV One time testing for all patients 45 and older May be repeated more frequently for patients with increased risk factors or exposure Hepatitis C One time testing for all patients 13 and older May be repeated more frequently for patients with increased risk factors or exposure Gonorrhea, Chlamydia Every 12 months for all sexually active persons 13-24 years Additional monitoring may be recommended for those who are considered high risk or who have symptoms Every 12 months for any woman on birth control, regardless of sexual activity PSA Men 58-73 years old with risk factors Additional screening may be recommended from age 75-69 based on risk factors, symptoms, and history  Vaccine Recommendations Tetanus Booster All adults every 10 years Flu Vaccine All patients 6 months and older every year COVID Vaccine All patients 12 years and older Initial dosing with booster May recommend additional booster based on age and health history HPV Vaccine 2 doses all patients age 72-26 Dosing may be considered for patients over 26 Shingles Vaccine (Shingrix) 2 doses all adults 55 years and older Pneumonia (Pneumovax 12) All adults 65 years and older May recommend earlier dosing based on health history One year apart from Prevnar 1 Pneumonia (Prevnar 80) All adults 65 years and older Dosed 1 year after Pneumovax 23 Pneumonia (Prevnar 20) One time  alternative to the two dosing of 13 and 23 For all adults with initial dose of 23, 20 is recommended 1 year later For all adults with initial dose of 13, 23 is still recommended as second option 1 year later

## 2023-10-04 ENCOUNTER — Ambulatory Visit: Payer: BC Managed Care – PPO | Admitting: Nurse Practitioner

## 2023-10-04 VITALS — BP 124/82 | HR 88 | Ht 64.0 in | Wt 143.0 lb

## 2023-10-04 DIAGNOSIS — E782 Mixed hyperlipidemia: Secondary | ICD-10-CM

## 2023-10-04 DIAGNOSIS — F439 Reaction to severe stress, unspecified: Secondary | ICD-10-CM

## 2023-10-04 DIAGNOSIS — M2550 Pain in unspecified joint: Secondary | ICD-10-CM | POA: Insufficient documentation

## 2023-10-04 DIAGNOSIS — Z23 Encounter for immunization: Secondary | ICD-10-CM | POA: Diagnosis not present

## 2023-10-04 DIAGNOSIS — G8929 Other chronic pain: Secondary | ICD-10-CM

## 2023-10-04 DIAGNOSIS — Z9889 Other specified postprocedural states: Secondary | ICD-10-CM

## 2023-10-04 DIAGNOSIS — Z87898 Personal history of other specified conditions: Secondary | ICD-10-CM | POA: Insufficient documentation

## 2023-10-04 DIAGNOSIS — N951 Menopausal and female climacteric states: Secondary | ICD-10-CM | POA: Diagnosis not present

## 2023-10-04 DIAGNOSIS — N898 Other specified noninflammatory disorders of vagina: Secondary | ICD-10-CM | POA: Insufficient documentation

## 2023-10-04 DIAGNOSIS — M5442 Lumbago with sciatica, left side: Secondary | ICD-10-CM

## 2023-10-04 DIAGNOSIS — Z0000 Encounter for general adult medical examination without abnormal findings: Secondary | ICD-10-CM | POA: Diagnosis not present

## 2023-10-04 DIAGNOSIS — F900 Attention-deficit hyperactivity disorder, predominantly inattentive type: Secondary | ICD-10-CM | POA: Diagnosis not present

## 2023-10-04 DIAGNOSIS — R5383 Other fatigue: Secondary | ICD-10-CM | POA: Insufficient documentation

## 2023-10-04 DIAGNOSIS — G479 Sleep disorder, unspecified: Secondary | ICD-10-CM | POA: Insufficient documentation

## 2023-10-04 DIAGNOSIS — E7800 Pure hypercholesterolemia, unspecified: Secondary | ICD-10-CM

## 2023-10-04 DIAGNOSIS — R072 Precordial pain: Secondary | ICD-10-CM

## 2023-10-04 MED ORDER — METHYLPHENIDATE HCL 10 MG PO TABS: 10.0000 mg | ORAL_TABLET | Freq: Two times a day (BID) | ORAL | 0 refills | Status: DC

## 2023-10-04 MED ORDER — AMBULATORY NON FORMULARY MEDICATION: 0 refills | Status: AC

## 2023-10-04 NOTE — Assessment & Plan Note (Signed)
-   Refill prescription for ezetimibe . Labs to be completed by GYN

## 2023-10-04 NOTE — Assessment & Plan Note (Signed)
 CPE completed today. Review of HM activities and recommendations discussed and provided on AVS. Anticipatory guidance, diet, and exercise recommendations provided. Medications, allergies, and hx reviewed and updated as necessary. Orders placed as listed below.  Plan: - Labs to be ordered with GYN visit- not fasting this morning. Will make changes as necessary based on results once received.  - I will review these results and send recommendations via MyChart or a telephone call.  - F/U with CPE in 1 year or sooner for acute/chronic health needs as directed.

## 2023-10-04 NOTE — Assessment & Plan Note (Signed)
 Managed with metoprolol  25 mg once daily. Cardiologist recommended increasing metoprolol  dose previously due to arm pain, which has resolved. - Refill prescription for metoprolol  25 mg once daily.

## 2023-10-04 NOTE — Assessment & Plan Note (Signed)
 Ongoing lower back pain, possibly related to bed comfort. Previous prescription for bed support not fulfilled. - Renew prescription for bed support.

## 2023-10-04 NOTE — Patient Instructions (Signed)
 It was great to see you today!  I recommend contacting the heath department in your hometown to see if they can help with your vaccine records.  We have given you the tetanus vaccine today.  I have sent the next 3 months of ritalin  in for you, 10mg  twice a day dosing.  Please let me know when you are running low on your ritalin  and I will send more in. We can check in with a quick virtual visit in 6 months to see how you are doing on the medication. The law requires us  to have intermittent checks since this is a controlled substance.   If you feel your situation anxiety or stress is getting worse, please reach out.  I recommend trying journaling to see if this helps.  Please have your OB/GYN send your lab results to me [phone] fax so I can update your record.   Good luck in school!!! You will do amazing!!

## 2023-10-04 NOTE — Assessment & Plan Note (Addendum)
 Currently managed with Ritalin , which is effective. Previous arm pain potentially linked to Adderall use has resolved since switching medications. No issues with sleep or appetite reported. - Prescribe Ritalin  for twice daily use. - 3 months of prescriptions sent to pharmacy.  - F/U in 6 months with virtual visit to monitor control as required by law.

## 2023-10-04 NOTE — Assessment & Plan Note (Signed)
 Significant stress due to ongoing child custody dispute with ex-husband. Emotional distress present, but no clinical depression diagnosed. Engaged in counseling and utilizing coping strategies. Discussed the therapeutic potential of writing letters to her son as a form of emotional expression. - Encourage writing letters to son as a therapeutic exercise.

## 2023-10-04 NOTE — Assessment & Plan Note (Signed)
 Currently using norethindrone/ethinyl estradiol  for contraception. Occasional hot flashes noted, but no breakthrough bleeding since May. - Refill prescription for norethindrone/ethinyl estradiol . - Monitor for any changes in symptoms or side effects.

## 2023-10-05 ENCOUNTER — Other Ambulatory Visit (HOSPITAL_COMMUNITY)

## 2023-10-06 DIAGNOSIS — D230 Other benign neoplasm of skin of lip: Secondary | ICD-10-CM | POA: Diagnosis not present

## 2023-10-06 DIAGNOSIS — L570 Actinic keratosis: Secondary | ICD-10-CM | POA: Diagnosis not present

## 2023-10-06 DIAGNOSIS — L814 Other melanin hyperpigmentation: Secondary | ICD-10-CM | POA: Diagnosis not present

## 2023-10-06 DIAGNOSIS — L821 Other seborrheic keratosis: Secondary | ICD-10-CM | POA: Diagnosis not present

## 2023-10-06 DIAGNOSIS — I781 Nevus, non-neoplastic: Secondary | ICD-10-CM | POA: Diagnosis not present

## 2023-10-06 DIAGNOSIS — L720 Epidermal cyst: Secondary | ICD-10-CM | POA: Diagnosis not present

## 2023-10-07 ENCOUNTER — Encounter: Payer: BC Managed Care – PPO | Admitting: Nurse Practitioner

## 2023-10-12 DIAGNOSIS — F4320 Adjustment disorder, unspecified: Secondary | ICD-10-CM | POA: Diagnosis not present

## 2023-11-02 DIAGNOSIS — F900 Attention-deficit hyperactivity disorder, predominantly inattentive type: Secondary | ICD-10-CM | POA: Diagnosis not present

## 2023-11-23 NOTE — Telephone Encounter (Signed)
 error

## 2023-11-29 NOTE — Telephone Encounter (Signed)
 error

## 2024-01-06 ENCOUNTER — Other Ambulatory Visit: Payer: Self-pay | Admitting: Nurse Practitioner

## 2024-01-06 ENCOUNTER — Telehealth: Payer: Self-pay | Admitting: Nurse Practitioner

## 2024-01-06 ENCOUNTER — Other Ambulatory Visit: Payer: Self-pay

## 2024-01-06 DIAGNOSIS — N951 Menopausal and female climacteric states: Secondary | ICD-10-CM

## 2024-01-06 MED ORDER — NORETHIN ACE-ETH ESTRAD-FE 1-20 MG-MCG(24) PO TABS: 1.0000 | ORAL_TABLET | Freq: Every day | ORAL | 0 refills | Status: DC

## 2024-01-06 NOTE — Telephone Encounter (Unsigned)
 Copied from CRM #[id number]. Topic: Clinical - Medication Refill >> Jan 06, 2024  3:07 PM Franky GRADE wrote: Medication: blisovi (birth control, patient does not remember the name)  Has the patient contacted their pharmacy? Yes, she went out of town and forgot her medication, she would like to know if a small [facility] [place] ) (Agent: If no, request that the patient contact the pharmacy for the refill. If patient does not wish to contact the pharmacy document the reason why and proceed with request.) (Agent: If yes, when and what did the pharmacy advise?)  This is the patient's preferred pharmacy:    [id number] [place] , [place] - [address] [place]  [place] [address] Phone: [phone] Fax: [phone]  Is this the correct pharmacy for this prescription? Yes If no, delete pharmacy and type the correct one.   Has the prescription been filled recently? Yes  Is the patient out of the medication? No, patient is out of town and forgot her medication, she needs a refill to hold her over until Sunday   Has the patient been seen for an appointment in the last year OR does the patient have an upcoming appointment? Yes  Can we respond through MyChart? Yes  Agent: Please be advised that Rx refills may take up to 3 business days. We ask that you follow-up with your pharmacy.

## 2024-01-07 ENCOUNTER — Encounter: Payer: Self-pay | Admitting: Nurse Practitioner

## 2024-02-11 ENCOUNTER — Encounter: Payer: Self-pay | Admitting: Nurse Practitioner

## 2024-02-11 ENCOUNTER — Other Ambulatory Visit: Payer: Self-pay

## 2024-02-11 DIAGNOSIS — N951 Menopausal and female climacteric states: Secondary | ICD-10-CM

## 2024-02-11 DIAGNOSIS — F900 Attention-deficit hyperactivity disorder, predominantly inattentive type: Secondary | ICD-10-CM

## 2024-02-11 MED ORDER — JUNEL FE 24 1-20 MG-MCG(24) PO TABS: 1.0000 | ORAL_TABLET | Freq: Every day | ORAL | 5 refills | Status: AC

## 2024-02-11 MED ORDER — METHYLPHENIDATE HCL 10 MG PO TABS: 10.0000 mg | ORAL_TABLET | Freq: Two times a day (BID) | ORAL | 0 refills | Status: DC

## 2024-02-11 MED ORDER — JUNEL FE 24 1-20 MG-MCG(24) PO TABS: 1.0000 | ORAL_TABLET | Freq: Every day | ORAL | 5 refills | Status: DC

## 2024-03-23 ENCOUNTER — Telehealth: Payer: Self-pay

## 2024-03-23 ENCOUNTER — Other Ambulatory Visit

## 2024-03-23 ENCOUNTER — Other Ambulatory Visit: Payer: Self-pay

## 2024-03-23 DIAGNOSIS — E782 Mixed hyperlipidemia: Secondary | ICD-10-CM

## 2024-03-23 DIAGNOSIS — E7800 Pure hypercholesterolemia, unspecified: Secondary | ICD-10-CM

## 2024-03-23 DIAGNOSIS — R635 Abnormal weight gain: Secondary | ICD-10-CM

## 2024-03-23 DIAGNOSIS — N951 Menopausal and female climacteric states: Secondary | ICD-10-CM

## 2024-03-23 DIAGNOSIS — Z8632 Personal history of gestational diabetes: Secondary | ICD-10-CM

## 2024-03-23 DIAGNOSIS — R5383 Other fatigue: Secondary | ICD-10-CM

## 2024-03-23 DIAGNOSIS — E663 Overweight: Secondary | ICD-10-CM

## 2024-03-23 DIAGNOSIS — E785 Hyperlipidemia, unspecified: Secondary | ICD-10-CM

## 2024-03-23 LAB — LIPID PANEL

## 2024-03-23 NOTE — Telephone Encounter (Signed)
 Copied from CRM #[id number]. Topic: Clinical - Request for Lab/Test Order >> Mar 23, 2024  8:05 AM [name] wrote: Reason for CRM: Patient called in to request to speak with clinic directly. I advised caller I will reach out.   I called [facility] with advised that [name] is not avaliable and advised to send CRM.   Patient is requesting an update on lab order and wants to come in now.   May you please assist.

## 2024-03-24 LAB — LIPID PANEL
Cholesterol, Total: 217 mg/dL — AB (ref 100–199)
HDL: 60 mg/dL
LDL CALC COMMENT:: 3.6 ratio (ref 0.0–4.4)
LDL Chol Calc (NIH): 139 mg/dL — AB (ref 0–99)
Triglycerides: 100 mg/dL (ref 0–149)
VLDL Cholesterol Cal: 18 mg/dL (ref 5–40)

## 2024-03-24 LAB — CBC WITH DIFFERENTIAL/PLATELET
Basophils Absolute: 0 x10E3/uL (ref 0.0–0.2)
Basos: 0 %
EOS (ABSOLUTE): 0.1 x10E3/uL (ref 0.0–0.4)
Eos: 1 %
Hematocrit: 42.8 % (ref 34.0–46.6)
Hemoglobin: 14 g/dL (ref 11.1–15.9)
Immature Grans (Abs): 0 x10E3/uL (ref 0.0–0.1)
Immature Granulocytes: 0 %
Lymphocytes Absolute: 2.9 x10E3/uL (ref 0.7–3.1)
Lymphs: 33 %
MCH: 31.3 pg (ref 26.6–33.0)
MCHC: 32.7 g/dL (ref 31.5–35.7)
MCV: 96 fL (ref 79–97)
Monocytes Absolute: 0.5 x10E3/uL (ref 0.1–0.9)
Monocytes: 5 %
Neutrophils Absolute: 5.3 x10E3/uL (ref 1.4–7.0)
Neutrophils: 61 %
Platelets: 278 x10E3/uL (ref 150–450)
RBC: 4.48 x10E6/uL (ref 3.77–5.28)
RDW: 11.9 % (ref 11.7–15.4)
WBC: 8.8 x10E3/uL (ref 3.4–10.8)

## 2024-03-24 LAB — CMP14+EGFR
ALT: 12 IU/L (ref 0–32)
AST: 17 IU/L (ref 0–40)
Albumin: 4.2 g/dL (ref 3.9–4.9)
Alkaline Phosphatase: 38 IU/L — AB (ref 41–116)
BUN/Creatinine Ratio: 14 (ref 9–23)
BUN: 11 mg/dL (ref 6–24)
Bilirubin Total: 0.5 mg/dL (ref 0.0–1.2)
CO2: 21 mmol/L (ref 20–29)
Calcium: 9 mg/dL (ref 8.7–10.2)
Chloride: 104 mmol/L (ref 96–106)
Creatinine, Ser: 0.78 mg/dL (ref 0.57–1.00)
Globulin, Total: 2.5 g/dL (ref 1.5–4.5)
Glucose: 88 mg/dL (ref 70–99)
Potassium: 4.1 mmol/L (ref 3.5–5.2)
Sodium: 138 mmol/L (ref 134–144)
Total Protein: 6.7 g/dL (ref 6.0–8.5)
eGFR: 93 mL/min/1.73

## 2024-03-24 LAB — TESTOSTERONE: Testosterone: 31 ng/dL (ref 4–50)

## 2024-03-24 LAB — ESTRADIOL: Estradiol: <5 pg/mL

## 2024-03-24 LAB — CORTISOL: Cortisol: 16.7 ug/dL (ref 6.2–19.4)

## 2024-03-24 LAB — PROGESTERONE: Progesterone: 0.2 ng/mL

## 2024-03-29 ENCOUNTER — Ambulatory Visit: Payer: Self-pay | Admitting: Nurse Practitioner

## 2024-04-05 NOTE — Progress Notes (Unsigned)
{  SEHM (Optional):34217}  [name], DNP, AGNP-c [facility]  [address] [place], [place] [address] [phone]   ESTABLISHED PATIENT- Chronic Health and/or Follow-Up Visit on 04/06/2024  There were no vitals taken for this visit.   Subjective:  No chief complaint on file.   *** ROS negative except for what is listed in HPI. History, Medications, Surgery, SDOH, and Family History reviewed and updated as appropriate.  Objective:  Physical Exam      Assessment & Plan:   Assessment & Plan    Evelyn FORBES Doing, DNP, AGNP-c  {SETIMEYorN (Optional):34216}

## 2024-04-06 ENCOUNTER — Encounter: Payer: Self-pay | Admitting: Nurse Practitioner

## 2024-04-06 ENCOUNTER — Telehealth: Payer: Self-pay | Admitting: Nurse Practitioner

## 2024-04-06 DIAGNOSIS — B3731 Acute candidiasis of vulva and vagina: Secondary | ICD-10-CM

## 2024-04-06 DIAGNOSIS — H44003 Unspecified purulent endophthalmitis, bilateral: Secondary | ICD-10-CM

## 2024-04-06 DIAGNOSIS — G479 Sleep disorder, unspecified: Secondary | ICD-10-CM

## 2024-04-06 DIAGNOSIS — B351 Tinea unguium: Secondary | ICD-10-CM

## 2024-04-06 DIAGNOSIS — F900 Attention-deficit hyperactivity disorder, predominantly inattentive type: Secondary | ICD-10-CM

## 2024-04-06 MED ORDER — METHYLPHENIDATE HCL 20 MG PO TABS: ORAL_TABLET | ORAL | 0 refills | Status: AC

## 2024-04-06 MED ORDER — TERBINAFINE HCL 250 MG PO TABS: 250.0000 mg | ORAL_TABLET | Freq: Every day | ORAL | 1 refills | Status: AC

## 2024-04-06 MED ORDER — ERYTHROMYCIN 5 MG/GM OP OINT: 1.0000 | TOPICAL_OINTMENT | Freq: Three times a day (TID) | OPHTHALMIC | 3 refills | Status: AC

## 2024-04-06 MED ORDER — CICLOPIROX 8 % EX SOLN: Freq: Every day | CUTANEOUS | 1 refills | Status: AC

## 2024-04-06 MED ORDER — FLUCONAZOLE 150 MG PO TABS: ORAL_TABLET | ORAL | 2 refills | Status: AC

## 2024-04-06 NOTE — Assessment & Plan Note (Signed)
 Experiencing Evelyn Sellers waking and difficulty returning to sleep, possibly related to perimenopause. Stress and anxiety may contribute. - Recommended magnesium glycinate 200 mg at bedtime to aid sleep and reduce anxiety.

## 2024-04-06 NOTE — Patient Instructions (Addendum)
 Magnesium Glycinate 200-400mg  at bedtime-  To help with sleep and anxiety.   Terbinafine  250mg - one tablet daily for toenail fungus. This will take at least 6 weeks and maybe the full 3 months to help clear this. It is a LONG process . Penlac - paint on and around the nails daily without removing. On the 7th day, remove with an alcohol swab and repeat the process. I would do this as long as you are taking the terbinafine .   Try 20mg  Ritalin  in the AM and 10-20 in the afternoon to see if this helps better with focus. If you don't feel like this is working or the dose is too much let me know and we can try something else.   Erythromycin  Ointment- wash the eye lids and surrounding area with baby shampoo twice a day. Apply the ointment as directed to the eye.   Most of all- hang in there. Nothing too great to bear will come your way. You are strong, intelligent, and made to make a difference. You are in this storm, but not alone and in the end, you will be stronger and able to help others who are facing the challenges that you have had to face.

## 2024-04-06 NOTE — Assessment & Plan Note (Signed)
 Current Ritalin  dose is insufficient at 10mg  BID, with partial response and increased stress. Reports waking up Zea Kostka and difficulty returning to sleep, possibly related to perimenopausal symptoms. These symptoms do not appear to correlate with the dosing of ritalin  in the afternoon. Increased dosage suggested for better focus and concentration.  - Increased Ritalin  to 20 mg in the morning and 10-20 mg in the afternoon. - Recommended magnesium glycinate 200 mg at bedtime to aid sleep and reduce anxiety. Orders:   methylphenidate  (RITALIN ) 20 MG tablet; 20mg  every morning and 10-20mg   in the afternoon for ADHD.   methylphenidate  (RITALIN ) 20 MG tablet; 20mg  every morning and 10-20mg   in the afternoon for ADHD.   methylphenidate  (RITALIN ) 20 MG tablet; 20mg  every morning and 10-20mg   in the afternoon for ADHD.

## 2024-10-04 ENCOUNTER — Encounter: Payer: Self-pay | Admitting: Nurse Practitioner
